# Patient Record
Sex: Female | Born: 1950 | Race: White | Hispanic: No | Marital: Married | Smoking: Never smoker
Health system: Southern US, Community
[De-identification: ages and names within clinical notes are randomized; demographics above are authoritative.]

## PROBLEM LIST (undated history)

## (undated) DIAGNOSIS — E039 Hypothyroidism, unspecified: Secondary | ICD-10-CM

## (undated) DIAGNOSIS — C44310 Basal cell carcinoma of skin of unspecified parts of face: Secondary | ICD-10-CM

## (undated) DIAGNOSIS — M659 Unspecified synovitis and tenosynovitis, unspecified site: Secondary | ICD-10-CM

## (undated) DIAGNOSIS — E7800 Pure hypercholesterolemia, unspecified: Secondary | ICD-10-CM

## (undated) DIAGNOSIS — Z9109 Other allergy status, other than to drugs and biological substances: Secondary | ICD-10-CM

## (undated) DIAGNOSIS — H8110 Benign paroxysmal vertigo, unspecified ear: Secondary | ICD-10-CM

## (undated) HISTORY — DX: Hypothyroidism, unspecified: E03.9

## (undated) HISTORY — DX: Other allergy status, other than to drugs and biological substances: Z91.09

## (undated) HISTORY — DX: Basal cell carcinoma of skin of unspecified parts of face: C44.310

## (undated) HISTORY — DX: Pure hypercholesterolemia, unspecified: E78.00

## (undated) HISTORY — DX: Synovitis and tenosynovitis, unspecified: M65.9

## (undated) HISTORY — PX: TONSILLECTOMY: SUR1361

## (undated) HISTORY — PX: OTHER SURGICAL HISTORY: SHX169

## (undated) HISTORY — DX: Benign paroxysmal vertigo, unspecified ear: H81.10

## (undated) HISTORY — DX: Unspecified synovitis and tenosynovitis, unspecified site: M65.90

---

## 1997-07-31 ENCOUNTER — Other Ambulatory Visit: Admission: RE | Admit: 1997-07-31 | Discharge: 1997-07-31 | Payer: Self-pay | Admitting: Gynecology

## 1999-02-27 ENCOUNTER — Other Ambulatory Visit: Admission: RE | Admit: 1999-02-27 | Discharge: 1999-02-27 | Payer: Self-pay | Admitting: Gynecology

## 1999-09-18 ENCOUNTER — Encounter: Admission: RE | Admit: 1999-09-18 | Discharge: 1999-09-18 | Payer: Self-pay | Admitting: Family Medicine

## 1999-09-18 ENCOUNTER — Encounter: Payer: Self-pay | Admitting: Family Medicine

## 2000-05-11 ENCOUNTER — Encounter: Payer: Self-pay | Admitting: Gynecology

## 2000-05-11 ENCOUNTER — Encounter: Admission: RE | Admit: 2000-05-11 | Discharge: 2000-05-11 | Payer: Self-pay | Admitting: Gynecology

## 2000-05-26 ENCOUNTER — Other Ambulatory Visit: Admission: RE | Admit: 2000-05-26 | Discharge: 2000-05-26 | Payer: Self-pay | Admitting: Gynecology

## 2000-06-04 ENCOUNTER — Encounter (INDEPENDENT_AMBULATORY_CARE_PROVIDER_SITE_OTHER): Payer: Self-pay

## 2000-06-04 ENCOUNTER — Other Ambulatory Visit: Admission: RE | Admit: 2000-06-04 | Discharge: 2000-06-04 | Payer: Self-pay | Admitting: Gynecology

## 2000-12-13 ENCOUNTER — Encounter: Admission: RE | Admit: 2000-12-13 | Discharge: 2000-12-13 | Payer: Self-pay | Admitting: Obstetrics and Gynecology

## 2000-12-13 ENCOUNTER — Encounter: Payer: Self-pay | Admitting: Obstetrics and Gynecology

## 2000-12-15 ENCOUNTER — Encounter: Payer: Self-pay | Admitting: Obstetrics and Gynecology

## 2000-12-15 ENCOUNTER — Encounter: Admission: RE | Admit: 2000-12-15 | Discharge: 2000-12-15 | Payer: Self-pay | Admitting: Obstetrics and Gynecology

## 2001-10-04 ENCOUNTER — Encounter: Admission: RE | Admit: 2001-10-04 | Discharge: 2001-10-04 | Payer: Self-pay | Admitting: Obstetrics and Gynecology

## 2001-10-04 ENCOUNTER — Encounter: Payer: Self-pay | Admitting: Obstetrics and Gynecology

## 2002-10-11 ENCOUNTER — Encounter: Payer: Self-pay | Admitting: Obstetrics and Gynecology

## 2002-10-11 ENCOUNTER — Encounter: Admission: RE | Admit: 2002-10-11 | Discharge: 2002-10-11 | Payer: Self-pay | Admitting: Obstetrics and Gynecology

## 2003-10-17 ENCOUNTER — Other Ambulatory Visit: Admission: RE | Admit: 2003-10-17 | Discharge: 2003-10-17 | Payer: Self-pay | Admitting: Obstetrics and Gynecology

## 2004-07-09 ENCOUNTER — Ambulatory Visit (HOSPITAL_COMMUNITY): Admission: RE | Admit: 2004-07-09 | Discharge: 2004-07-09 | Payer: Self-pay | Admitting: Obstetrics and Gynecology

## 2004-07-16 ENCOUNTER — Encounter: Admission: RE | Admit: 2004-07-16 | Discharge: 2004-07-16 | Payer: Self-pay | Admitting: Obstetrics and Gynecology

## 2004-11-25 ENCOUNTER — Other Ambulatory Visit: Admission: RE | Admit: 2004-11-25 | Discharge: 2004-11-25 | Payer: Self-pay | Admitting: Obstetrics and Gynecology

## 2005-02-17 ENCOUNTER — Encounter: Admission: RE | Admit: 2005-02-17 | Discharge: 2005-05-18 | Payer: Self-pay

## 2005-07-13 ENCOUNTER — Ambulatory Visit (HOSPITAL_COMMUNITY): Admission: RE | Admit: 2005-07-13 | Discharge: 2005-07-13 | Payer: Self-pay | Admitting: Obstetrics and Gynecology

## 2005-11-26 ENCOUNTER — Other Ambulatory Visit: Admission: RE | Admit: 2005-11-26 | Discharge: 2005-11-26 | Payer: Self-pay | Admitting: Obstetrics and Gynecology

## 2006-08-04 ENCOUNTER — Ambulatory Visit (HOSPITAL_COMMUNITY): Admission: RE | Admit: 2006-08-04 | Discharge: 2006-08-04 | Payer: Self-pay | Admitting: Obstetrics and Gynecology

## 2007-02-11 ENCOUNTER — Other Ambulatory Visit: Admission: RE | Admit: 2007-02-11 | Discharge: 2007-02-11 | Payer: Self-pay | Admitting: Obstetrics and Gynecology

## 2007-11-24 ENCOUNTER — Ambulatory Visit (HOSPITAL_COMMUNITY): Admission: RE | Admit: 2007-11-24 | Discharge: 2007-11-24 | Payer: Self-pay | Admitting: Obstetrics and Gynecology

## 2008-02-16 ENCOUNTER — Other Ambulatory Visit: Admission: RE | Admit: 2008-02-16 | Discharge: 2008-02-16 | Payer: Self-pay | Admitting: Obstetrics and Gynecology

## 2008-06-05 ENCOUNTER — Ambulatory Visit: Payer: Self-pay | Admitting: Internal Medicine

## 2008-06-13 LAB — CBC WITH DIFFERENTIAL/PLATELET
BASO%: 0.4 % (ref 0.0–2.0)
Basophils Absolute: 0 10*3/uL (ref 0.0–0.1)
EOS%: 2.3 % (ref 0.0–7.0)
HGB: 15.7 g/dL (ref 11.6–15.9)
MCH: 30.3 pg (ref 25.1–34.0)
MONO%: 7.8 % (ref 0.0–14.0)
Platelets: 69 10*3/uL — ABNORMAL LOW (ref 145–400)
RBC: 5.2 10*6/uL (ref 3.70–5.45)
RDW: 13.1 % (ref 11.2–14.5)
WBC: 5.1 10*3/uL (ref 3.9–10.3)

## 2008-06-13 LAB — COMPREHENSIVE METABOLIC PANEL
Alkaline Phosphatase: 74 U/L (ref 39–117)
CO2: 25 mEq/L (ref 19–32)
Calcium: 9.9 mg/dL (ref 8.4–10.5)
Glucose, Bld: 97 mg/dL (ref 70–99)
Potassium: 4.2 mEq/L (ref 3.5–5.3)

## 2008-06-13 LAB — LACTATE DEHYDROGENASE: LDH: 132 U/L (ref 94–250)

## 2008-07-03 ENCOUNTER — Ambulatory Visit (HOSPITAL_COMMUNITY): Admission: RE | Admit: 2008-07-03 | Discharge: 2008-07-03 | Payer: Self-pay | Admitting: Internal Medicine

## 2008-07-05 LAB — CBC WITH DIFFERENTIAL/PLATELET
Basophils Absolute: 0 10*3/uL (ref 0.0–0.1)
EOS%: 2.2 % (ref 0.0–7.0)
HCT: 45.8 % (ref 34.8–46.6)
HGB: 16 g/dL — ABNORMAL HIGH (ref 11.6–15.9)
MONO%: 7.5 % (ref 0.0–14.0)
lymph#: 1.5 10*3/uL (ref 0.9–3.3)

## 2008-10-02 ENCOUNTER — Ambulatory Visit: Payer: Self-pay | Admitting: Internal Medicine

## 2008-10-04 LAB — CBC WITH DIFFERENTIAL/PLATELET
BASO%: 0.4 % (ref 0.0–2.0)
Basophils Absolute: 0 10*3/uL (ref 0.0–0.1)
EOS%: 1.7 % (ref 0.0–7.0)
Eosinophils Absolute: 0.1 10*3/uL (ref 0.0–0.5)
MCHC: 35.6 g/dL (ref 31.5–36.0)
MCV: 86.9 fL (ref 79.5–101.0)
MONO%: 7.6 % (ref 0.0–14.0)
Platelets: 74 10*3/uL — ABNORMAL LOW (ref 145–400)
RBC: 5.14 10*6/uL (ref 3.70–5.45)
RDW: 13.7 % (ref 11.2–14.5)
WBC: 6.1 10*3/uL (ref 3.9–10.3)
lymph#: 1.8 10*3/uL (ref 0.9–3.3)

## 2008-10-04 LAB — LACTATE DEHYDROGENASE: LDH: 119 U/L (ref 94–250)

## 2008-12-31 ENCOUNTER — Ambulatory Visit: Payer: Self-pay | Admitting: Internal Medicine

## 2009-01-02 LAB — CBC WITH DIFFERENTIAL/PLATELET
HCT: 45.3 % (ref 34.8–46.6)
HGB: 15.9 g/dL (ref 11.6–15.9)
MCH: 30.9 pg (ref 25.1–34.0)
MCV: 87.8 fL (ref 79.5–101.0)
NEUT#: 4.3 10*3/uL (ref 1.5–6.5)
RDW: 13.2 % (ref 11.2–14.5)
WBC: 7.4 10*3/uL (ref 3.9–10.3)
lymph#: 2.4 10*3/uL (ref 0.9–3.3)

## 2009-01-02 LAB — COMPREHENSIVE METABOLIC PANEL
AST: 24 U/L (ref 0–37)
Albumin: 4 g/dL (ref 3.5–5.2)
BUN: 13 mg/dL (ref 6–23)
Calcium: 9.6 mg/dL (ref 8.4–10.5)
Chloride: 106 mEq/L (ref 96–112)
Glucose, Bld: 114 mg/dL — ABNORMAL HIGH (ref 70–99)
Total Bilirubin: 0.9 mg/dL (ref 0.3–1.2)
Total Protein: 7.1 g/dL (ref 6.0–8.3)

## 2009-03-11 ENCOUNTER — Other Ambulatory Visit: Admission: RE | Admit: 2009-03-11 | Discharge: 2009-03-11 | Payer: Self-pay | Admitting: Obstetrics and Gynecology

## 2009-07-01 ENCOUNTER — Ambulatory Visit: Payer: Self-pay | Admitting: Internal Medicine

## 2009-07-03 LAB — CBC WITH DIFFERENTIAL/PLATELET
BASO%: 0.3 % (ref 0.0–2.0)
Basophils Absolute: 0 10*3/uL (ref 0.0–0.1)
EOS%: 2.3 % (ref 0.0–7.0)
Eosinophils Absolute: 0.2 10*3/uL (ref 0.0–0.5)
LYMPH%: 38.2 % (ref 14.0–49.7)
MCH: 30.1 pg (ref 25.1–34.0)
MONO#: 0.6 10*3/uL (ref 0.1–0.9)
Platelets: 82 10*3/uL — ABNORMAL LOW (ref 145–400)
RDW: 13.2 % (ref 11.2–14.5)
WBC: 7.2 10*3/uL (ref 3.9–10.3)

## 2009-09-27 ENCOUNTER — Ambulatory Visit: Payer: Self-pay | Admitting: Internal Medicine

## 2009-10-01 LAB — CBC WITH DIFFERENTIAL/PLATELET
EOS%: 2.6 % (ref 0.0–7.0)
Eosinophils Absolute: 0.2 10*3/uL (ref 0.0–0.5)
HGB: 16.8 g/dL — ABNORMAL HIGH (ref 11.6–15.9)
MCV: 88.5 fL (ref 79.5–101.0)
MONO#: 0.5 10*3/uL (ref 0.1–0.9)
MONO%: 5.5 % (ref 0.0–14.0)
NEUT#: 4.8 10*3/uL (ref 1.5–6.5)
RDW: 13 % (ref 11.2–14.5)
WBC: 8.3 10*3/uL (ref 3.9–10.3)
lymph#: 2.8 10*3/uL (ref 0.9–3.3)

## 2010-03-31 ENCOUNTER — Ambulatory Visit: Payer: Self-pay | Admitting: Internal Medicine

## 2010-05-10 IMAGING — US US ABDOMEN COMPLETE
1 series · 14 of 25 positions shown · non-contrast
Comparison: None

CLINICAL DATA: Thrombocytopenia; evaluate for hepatosplenomegaly

ABDOMINAL ULTRASOUND
TECHNIQUE: Abdominal ultrasound examination was performed
including evaluation of the liver, gallbladder, bile ducts,
pancreas, kidneys, spleen, IVC, and abdominal aorta.

[Series 1: us abdomen complete · 0.31mm/px · 14 of 44 slices shown]
[im 1/44]
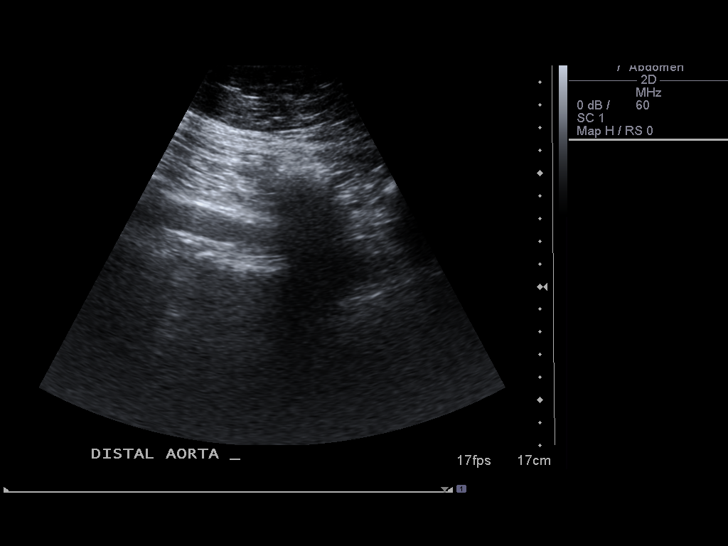
[im 4/44]
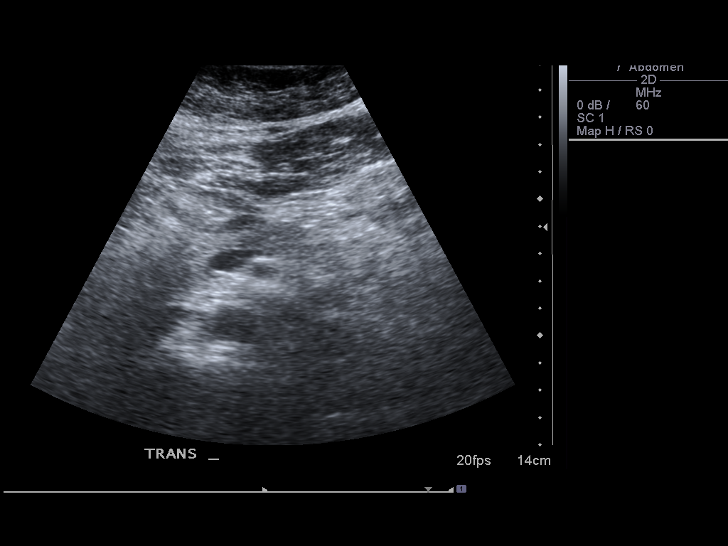
[im 8/44]
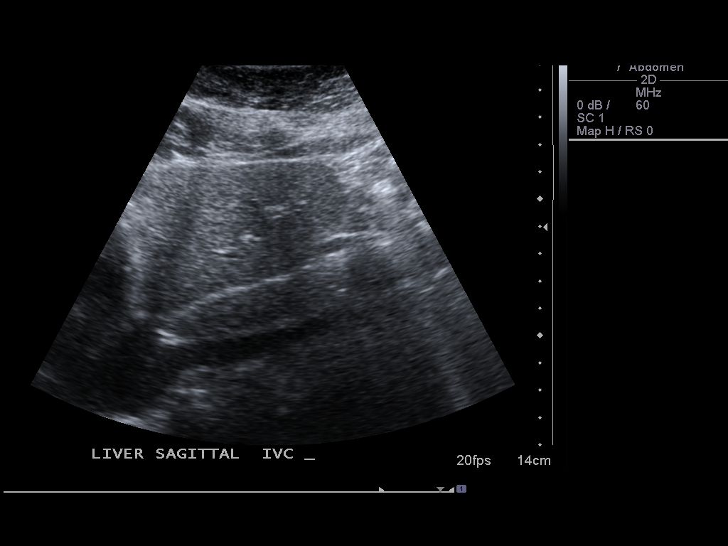
[im 11/44]
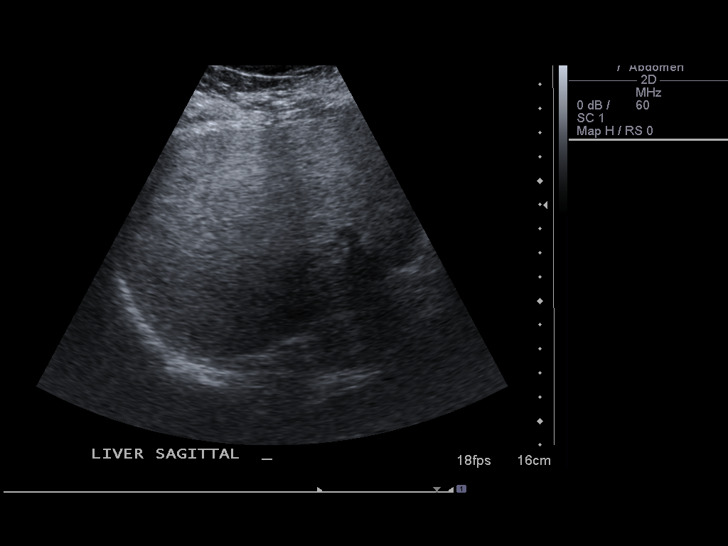
[im 15/44]
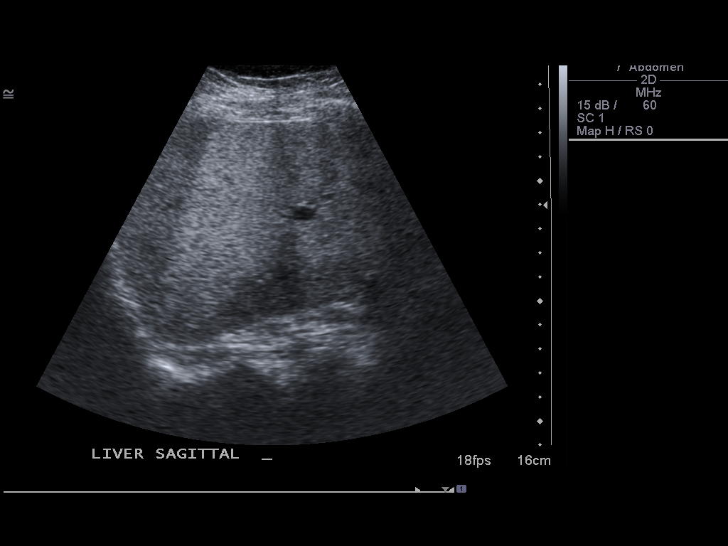
[im 17/44]
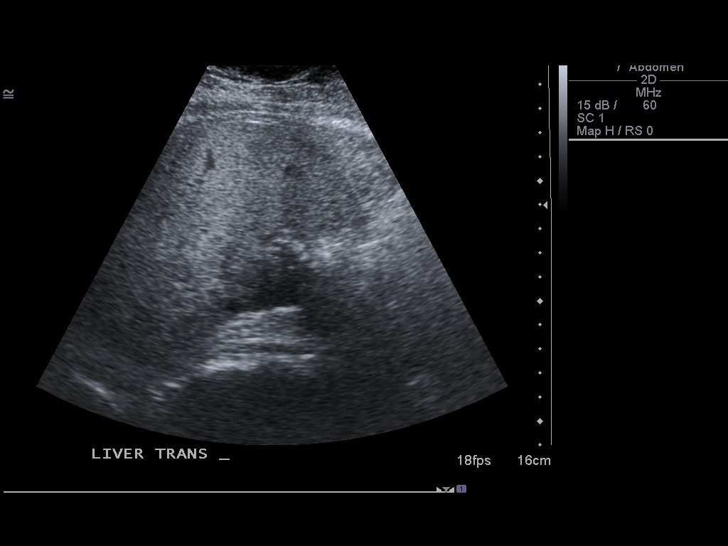
[im 20/44]
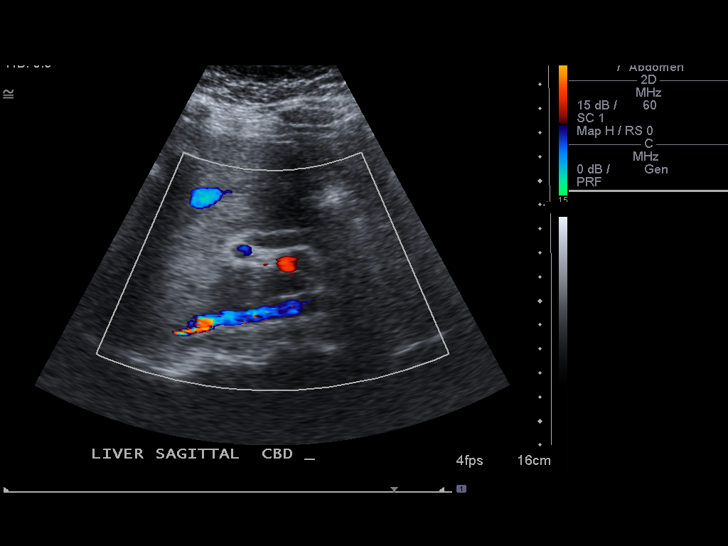
[im 24/44]
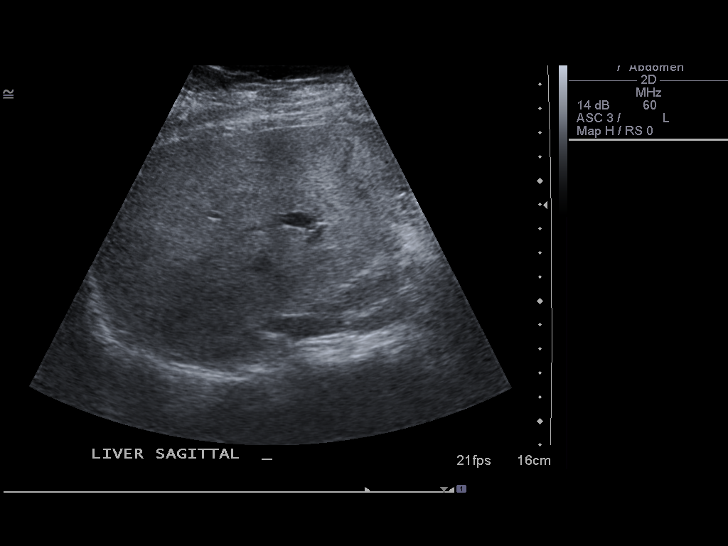
[im 27/44]
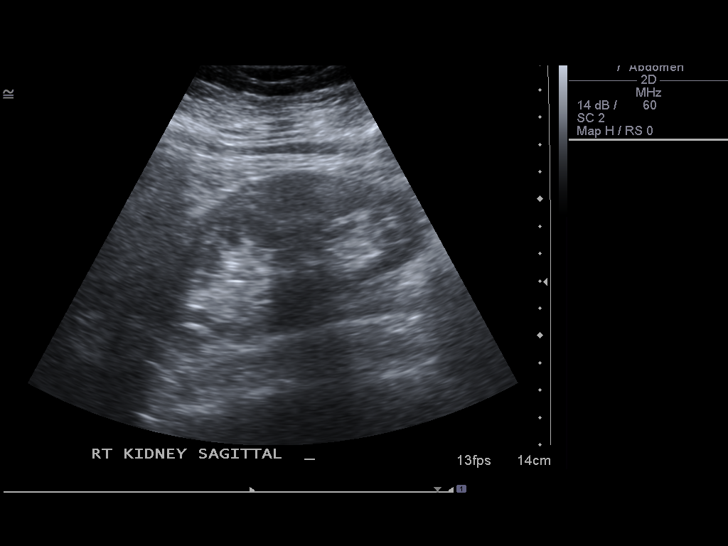
[im 29/44]
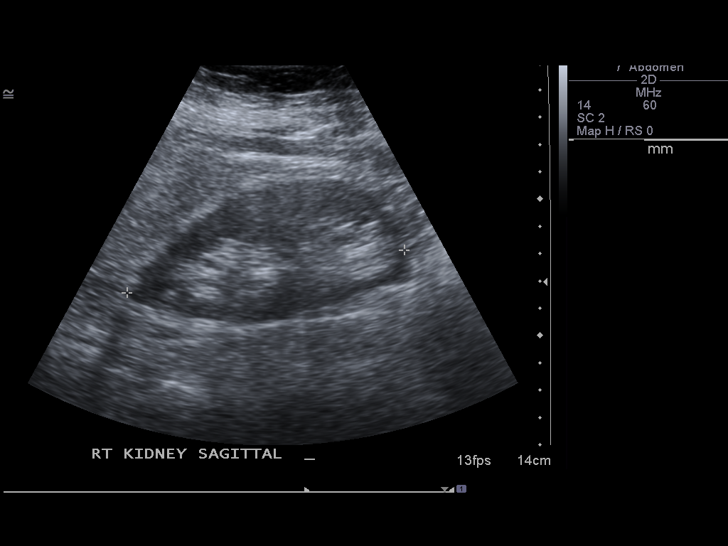
[im 33/44]
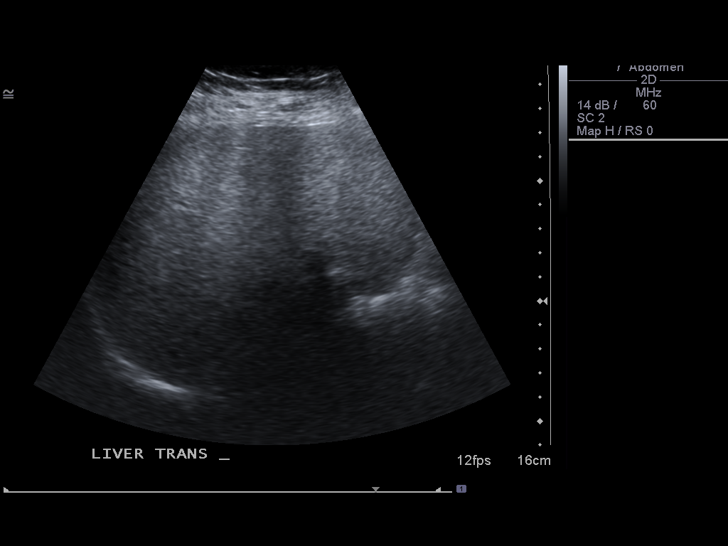
[im 36/44]
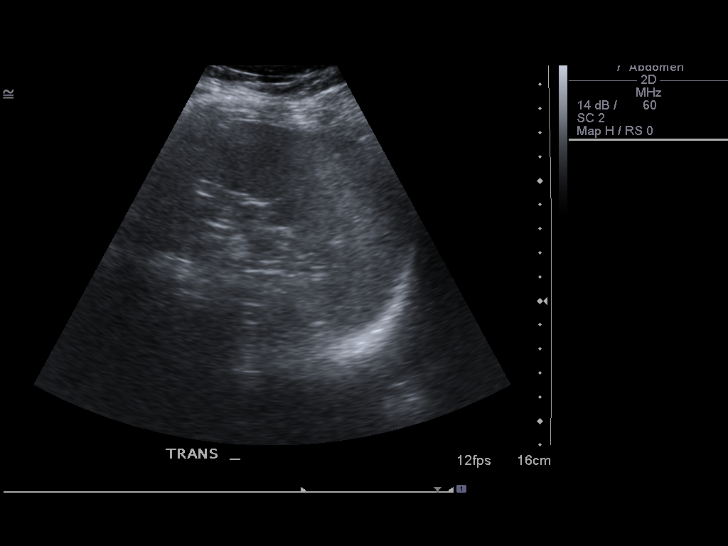
[im 40/44]
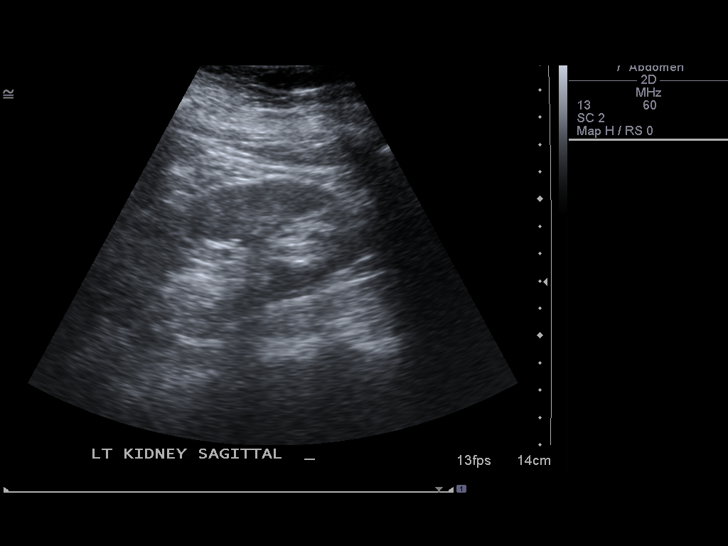
[im 44/44]
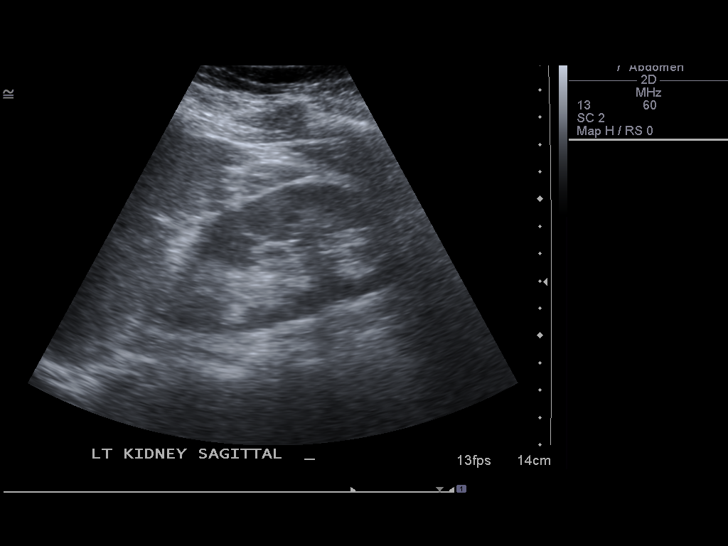

[14 of 25 positions shown; findings below may reference images not displayed]

FINDINGS: Gallbladder:  Surgically absent.

Common bile duct: Within normal limits in caliber.

Liver:  No focal parenchymal abnormalities.  Normal size.  There is
marked, diffuse fatty infiltration.

Inferior vena cava:  Visualized portion unremarkable.

Pancreas:  Visualized portion unremarkable.

Spleen:  Within normal limits in size and echogenicity.

Right kidney:  Within normal limits in size and echogenicity. No
evidence of mass or hydronephrosis.

Left kidney:  Within normal limits in size and echogenicity. No
evidence of mass or hydronephrosis.

Abdominal aorta:  Within normal limits in caliber.
IMPRESSION: There is marked, diffuse fatty infiltration of the liver.

## 2012-05-11 ENCOUNTER — Other Ambulatory Visit (HOSPITAL_COMMUNITY): Payer: Self-pay | Admitting: Obstetrics and Gynecology

## 2012-05-11 DIAGNOSIS — R1031 Right lower quadrant pain: Secondary | ICD-10-CM

## 2012-05-16 ENCOUNTER — Ambulatory Visit (HOSPITAL_COMMUNITY)
Admission: RE | Admit: 2012-05-16 | Discharge: 2012-05-16 | Disposition: A | Discharge status: Home / Self Care | Payer: BC Managed Care – PPO | Source: Ambulatory Visit | Attending: Obstetrics and Gynecology | Admitting: Obstetrics and Gynecology

## 2012-05-16 ENCOUNTER — Encounter (HOSPITAL_COMMUNITY): Payer: Self-pay

## 2012-05-16 DIAGNOSIS — R1031 Right lower quadrant pain: Secondary | ICD-10-CM | POA: Insufficient documentation

## 2012-05-16 DIAGNOSIS — K5730 Diverticulosis of large intestine without perforation or abscess without bleeding: Secondary | ICD-10-CM | POA: Insufficient documentation

## 2012-05-16 MED ORDER — IOHEXOL 300 MG/ML  SOLN
100.0000 mL | Freq: Once | INTRAMUSCULAR | Status: AC | PRN
  Administered 2012-05-16: 100 mL via INTRAVENOUS

## 2013-07-04 ENCOUNTER — Encounter: Payer: Self-pay | Admitting: Podiatry

## 2013-07-04 ENCOUNTER — Ambulatory Visit (INDEPENDENT_AMBULATORY_CARE_PROVIDER_SITE_OTHER): Payer: BC Managed Care – PPO | Admitting: Podiatry

## 2013-07-04 VITALS — BP 114/86 | HR 82 | Resp 12

## 2013-07-04 DIAGNOSIS — L600 Ingrowing nail: Secondary | ICD-10-CM

## 2013-07-04 MED ORDER — NEOMYCIN-POLYMYXIN-HC 3.5-10000-1 OT SOLN: OTIC | Status: DC

## 2013-07-04 NOTE — Progress Notes (Signed)
   Subjective:    Patient ID: Kirsten Jones, female    DOB: 05-19-1950, 63 y.o.   MRN: [id number]  HPI PT STATED RT FOOT GREAT TOENAIL IS A LITTLE SORE FOR 1 WEEK. THE TOENAIL IS GETTING WORSE. TRIED TO KEEP TRIM BUT STILL HURTING .    Review of Systems  HENT: Positive for hearing loss.   All other systems reviewed and are negative.       Objective:   Physical Exam: I have reviewed her past history medications allergies surgeries and social history. Pulses are strongly palpable right foot. Neurologic sensorium is intact. Tibial border of the hallux right does demonstrate erythema and edema with gross granulation tissue and is painful on palpation.          Assessment & Plan:  Assessment: Paronychia abscess ingrown nail tibial border hallux right.  Plan: Matrixectomy was performed today under local anesthetic. She tolerated procedure well she will start soaking on a twice a day basis in Betadine warm water and apply Cortisporin otic as directed. I will followup with her in one week.

## 2013-07-04 NOTE — Patient Instructions (Signed)

## 2013-07-13 ENCOUNTER — Encounter: Payer: Self-pay | Admitting: Podiatry

## 2013-07-13 ENCOUNTER — Ambulatory Visit (INDEPENDENT_AMBULATORY_CARE_PROVIDER_SITE_OTHER): Payer: BC Managed Care – PPO | Admitting: Podiatry

## 2013-07-13 VITALS — BP 112/86 | HR 80 | Resp 16

## 2013-07-13 DIAGNOSIS — L600 Ingrowing nail: Secondary | ICD-10-CM

## 2013-07-13 NOTE — Progress Notes (Signed)
She presents today for followup of a matrixectomy of the hallux. She denies fever chills nausea vomiting muscle aches and pains. She's as I don't think it looks very good and I stopped soaking.  Objective: Vital signs are stable she is alert and oriented x3. The margin of the hallux right tibial border appears to be healing quite nicely there is no erythema edema cellulitis drainage or odor.  Assessment: Well-healing matrixectomy hallux right.  Plan: Discontinue Betadine start with Epsom salts in warm water soaks covered in the day and Levaquin night continued Cortisporin Otic drops.

## 2015-04-24 DIAGNOSIS — H52223 Regular astigmatism, bilateral: Secondary | ICD-10-CM | POA: Diagnosis not present

## 2015-04-24 DIAGNOSIS — H524 Presbyopia: Secondary | ICD-10-CM | POA: Diagnosis not present

## 2015-04-24 DIAGNOSIS — H5213 Myopia, bilateral: Secondary | ICD-10-CM | POA: Diagnosis not present

## 2015-04-24 DIAGNOSIS — H2513 Age-related nuclear cataract, bilateral: Secondary | ICD-10-CM | POA: Diagnosis not present

## 2015-05-14 DIAGNOSIS — L718 Other rosacea: Secondary | ICD-10-CM | POA: Diagnosis not present

## 2015-05-14 DIAGNOSIS — C44712 Basal cell carcinoma of skin of right lower limb, including hip: Secondary | ICD-10-CM | POA: Diagnosis not present

## 2015-05-14 DIAGNOSIS — L570 Actinic keratosis: Secondary | ICD-10-CM | POA: Diagnosis not present

## 2015-05-14 DIAGNOSIS — D485 Neoplasm of uncertain behavior of skin: Secondary | ICD-10-CM | POA: Diagnosis not present

## 2015-07-03 DIAGNOSIS — E28310 Symptomatic premature menopause: Secondary | ICD-10-CM | POA: Diagnosis not present

## 2015-07-03 DIAGNOSIS — E569 Vitamin deficiency, unspecified: Secondary | ICD-10-CM | POA: Diagnosis not present

## 2015-07-03 DIAGNOSIS — D510 Vitamin B12 deficiency anemia due to intrinsic factor deficiency: Secondary | ICD-10-CM | POA: Diagnosis not present

## 2015-07-03 DIAGNOSIS — E039 Hypothyroidism, unspecified: Secondary | ICD-10-CM | POA: Diagnosis not present

## 2015-07-03 DIAGNOSIS — E559 Vitamin D deficiency, unspecified: Secondary | ICD-10-CM | POA: Diagnosis not present

## 2015-08-06 DIAGNOSIS — C44722 Squamous cell carcinoma of skin of right lower limb, including hip: Secondary | ICD-10-CM | POA: Diagnosis not present

## 2015-08-06 DIAGNOSIS — D485 Neoplasm of uncertain behavior of skin: Secondary | ICD-10-CM | POA: Diagnosis not present

## 2015-08-06 DIAGNOSIS — L309 Dermatitis, unspecified: Secondary | ICD-10-CM | POA: Diagnosis not present

## 2015-08-06 DIAGNOSIS — L819 Disorder of pigmentation, unspecified: Secondary | ICD-10-CM | POA: Diagnosis not present

## 2015-08-06 DIAGNOSIS — D225 Melanocytic nevi of trunk: Secondary | ICD-10-CM | POA: Diagnosis not present

## 2015-08-06 DIAGNOSIS — L570 Actinic keratosis: Secondary | ICD-10-CM | POA: Diagnosis not present

## 2015-08-06 DIAGNOSIS — Z85828 Personal history of other malignant neoplasm of skin: Secondary | ICD-10-CM | POA: Diagnosis not present

## 2015-08-06 DIAGNOSIS — L821 Other seborrheic keratosis: Secondary | ICD-10-CM | POA: Diagnosis not present

## 2015-08-06 DIAGNOSIS — L814 Other melanin hyperpigmentation: Secondary | ICD-10-CM | POA: Diagnosis not present

## 2015-09-25 DIAGNOSIS — D510 Vitamin B12 deficiency anemia due to intrinsic factor deficiency: Secondary | ICD-10-CM | POA: Diagnosis not present

## 2015-09-25 DIAGNOSIS — E039 Hypothyroidism, unspecified: Secondary | ICD-10-CM | POA: Diagnosis not present

## 2015-09-25 DIAGNOSIS — E569 Vitamin deficiency, unspecified: Secondary | ICD-10-CM | POA: Diagnosis not present

## 2015-09-25 DIAGNOSIS — R799 Abnormal finding of blood chemistry, unspecified: Secondary | ICD-10-CM | POA: Diagnosis not present

## 2015-09-25 DIAGNOSIS — E28310 Symptomatic premature menopause: Secondary | ICD-10-CM | POA: Diagnosis not present

## 2015-09-25 DIAGNOSIS — R5382 Chronic fatigue, unspecified: Secondary | ICD-10-CM | POA: Diagnosis not present

## 2015-09-25 DIAGNOSIS — E559 Vitamin D deficiency, unspecified: Secondary | ICD-10-CM | POA: Diagnosis not present

## 2015-10-04 DIAGNOSIS — R0789 Other chest pain: Secondary | ICD-10-CM | POA: Diagnosis not present

## 2015-10-04 DIAGNOSIS — N8110 Cystocele, unspecified: Secondary | ICD-10-CM | POA: Diagnosis not present

## 2015-10-04 DIAGNOSIS — N819 Female genital prolapse, unspecified: Secondary | ICD-10-CM | POA: Diagnosis not present

## 2015-10-23 DIAGNOSIS — R0789 Other chest pain: Secondary | ICD-10-CM | POA: Diagnosis not present

## 2015-10-23 DIAGNOSIS — R0602 Shortness of breath: Secondary | ICD-10-CM | POA: Diagnosis not present

## 2015-10-23 DIAGNOSIS — Z8249 Family history of ischemic heart disease and other diseases of the circulatory system: Secondary | ICD-10-CM | POA: Diagnosis not present

## 2015-10-23 DIAGNOSIS — E785 Hyperlipidemia, unspecified: Secondary | ICD-10-CM | POA: Diagnosis not present

## 2015-10-23 DIAGNOSIS — R079 Chest pain, unspecified: Secondary | ICD-10-CM | POA: Diagnosis not present

## 2015-10-23 DIAGNOSIS — R0600 Dyspnea, unspecified: Secondary | ICD-10-CM | POA: Diagnosis not present

## 2015-11-05 DIAGNOSIS — R0600 Dyspnea, unspecified: Secondary | ICD-10-CM | POA: Diagnosis not present

## 2015-11-15 DIAGNOSIS — Z85828 Personal history of other malignant neoplasm of skin: Secondary | ICD-10-CM | POA: Diagnosis not present

## 2015-11-15 DIAGNOSIS — L812 Freckles: Secondary | ICD-10-CM | POA: Diagnosis not present

## 2015-11-15 DIAGNOSIS — L821 Other seborrheic keratosis: Secondary | ICD-10-CM | POA: Diagnosis not present

## 2015-12-17 ENCOUNTER — Other Ambulatory Visit: Payer: Self-pay | Admitting: Obstetrics and Gynecology

## 2015-12-17 ENCOUNTER — Other Ambulatory Visit (HOSPITAL_COMMUNITY)
Admission: RE | Admit: 2015-12-17 | Discharge: 2015-12-17 | Disposition: A | Discharge status: Home / Self Care | Payer: PPO | Source: Ambulatory Visit | Attending: Obstetrics and Gynecology | Admitting: Obstetrics and Gynecology

## 2015-12-17 DIAGNOSIS — Z01419 Encounter for gynecological examination (general) (routine) without abnormal findings: Secondary | ICD-10-CM | POA: Diagnosis not present

## 2015-12-17 DIAGNOSIS — N950 Postmenopausal bleeding: Secondary | ICD-10-CM | POA: Diagnosis not present

## 2015-12-17 DIAGNOSIS — Z1151 Encounter for screening for human papillomavirus (HPV): Secondary | ICD-10-CM | POA: Insufficient documentation

## 2015-12-17 DIAGNOSIS — Z01411 Encounter for gynecological examination (general) (routine) with abnormal findings: Secondary | ICD-10-CM | POA: Diagnosis not present

## 2015-12-18 LAB — CYTOLOGY - PAP

## 2015-12-24 DIAGNOSIS — Z85828 Personal history of other malignant neoplasm of skin: Secondary | ICD-10-CM | POA: Diagnosis not present

## 2015-12-24 DIAGNOSIS — L738 Other specified follicular disorders: Secondary | ICD-10-CM | POA: Diagnosis not present

## 2016-01-09 DIAGNOSIS — Z1211 Encounter for screening for malignant neoplasm of colon: Secondary | ICD-10-CM | POA: Diagnosis not present

## 2016-01-09 DIAGNOSIS — Z1389 Encounter for screening for other disorder: Secondary | ICD-10-CM | POA: Diagnosis not present

## 2016-01-09 DIAGNOSIS — N950 Postmenopausal bleeding: Secondary | ICD-10-CM | POA: Diagnosis not present

## 2016-01-09 DIAGNOSIS — Z0000 Encounter for general adult medical examination without abnormal findings: Secondary | ICD-10-CM | POA: Diagnosis not present

## 2016-01-09 DIAGNOSIS — E7800 Pure hypercholesterolemia, unspecified: Secondary | ICD-10-CM | POA: Diagnosis not present

## 2016-01-09 DIAGNOSIS — Z23 Encounter for immunization: Secondary | ICD-10-CM | POA: Diagnosis not present

## 2016-01-09 DIAGNOSIS — Z974 Presence of external hearing-aid: Secondary | ICD-10-CM | POA: Diagnosis not present

## 2016-01-22 ENCOUNTER — Other Ambulatory Visit: Payer: Self-pay | Admitting: Obstetrics and Gynecology

## 2016-01-22 DIAGNOSIS — N950 Postmenopausal bleeding: Secondary | ICD-10-CM | POA: Diagnosis not present

## 2016-02-19 DIAGNOSIS — R5382 Chronic fatigue, unspecified: Secondary | ICD-10-CM | POA: Diagnosis not present

## 2016-02-19 DIAGNOSIS — Z780 Asymptomatic menopausal state: Secondary | ICD-10-CM | POA: Diagnosis not present

## 2016-02-19 DIAGNOSIS — R799 Abnormal finding of blood chemistry, unspecified: Secondary | ICD-10-CM | POA: Diagnosis not present

## 2016-02-19 DIAGNOSIS — D510 Vitamin B12 deficiency anemia due to intrinsic factor deficiency: Secondary | ICD-10-CM | POA: Diagnosis not present

## 2016-02-19 DIAGNOSIS — E569 Vitamin deficiency, unspecified: Secondary | ICD-10-CM | POA: Diagnosis not present

## 2016-02-19 DIAGNOSIS — E559 Vitamin D deficiency, unspecified: Secondary | ICD-10-CM | POA: Diagnosis not present

## 2016-02-19 DIAGNOSIS — E039 Hypothyroidism, unspecified: Secondary | ICD-10-CM | POA: Diagnosis not present

## 2016-03-20 ENCOUNTER — Other Ambulatory Visit: Payer: Self-pay | Admitting: Gastroenterology

## 2016-03-29 DIAGNOSIS — J209 Acute bronchitis, unspecified: Secondary | ICD-10-CM | POA: Diagnosis not present

## 2016-03-29 DIAGNOSIS — R05 Cough: Secondary | ICD-10-CM | POA: Diagnosis not present

## 2016-05-05 ENCOUNTER — Encounter (HOSPITAL_COMMUNITY): Payer: Self-pay | Admitting: Anesthesiology

## 2016-05-05 ENCOUNTER — Encounter (HOSPITAL_COMMUNITY): Admission: RE | Payer: Self-pay | Source: Ambulatory Visit

## 2016-05-05 ENCOUNTER — Ambulatory Visit (HOSPITAL_COMMUNITY): Admission: RE | Admit: 2016-05-05 | Payer: PPO | Source: Ambulatory Visit | Admitting: Gastroenterology

## 2016-05-05 SURGERY — COLONOSCOPY WITH PROPOFOL
Anesthesia: Monitor Anesthesia Care

## 2016-05-05 MED ORDER — PROPOFOL 10 MG/ML IV BOLUS
INTRAVENOUS | Status: AC
  Filled 2016-05-05: qty 40

## 2016-05-05 NOTE — Anesthesia Preprocedure Evaluation (Deleted)
Anesthesia Evaluation  Patient identified by MRN, date of birth, ID band Patient awake    Reviewed: Allergy & Precautions, NPO status , Patient's Chart, lab work & pertinent test results  Airway Mallampati: II  TM Distance: >3 FB Neck ROM: Full    Dental  (+) Teeth Intact, Dental Advisory Given   Pulmonary neg pulmonary ROS,    Pulmonary exam normal breath sounds clear to auscultation       Cardiovascular Exercise Tolerance: Good negative cardio ROS Normal cardiovascular exam Rhythm:Regular Rate:Normal     Neuro/Psych negative neurological ROS     GI/Hepatic negative GI ROS, Neg liver ROS,   Endo/Other  Hypothyroidism   Renal/GU negative Renal ROS     Musculoskeletal negative musculoskeletal ROS (+)   Abdominal   Peds  Hematology negative hematology ROS (+)   Anesthesia Other Findings Day of surgery medications reviewed with the patient.  Reproductive/Obstetrics                             Anesthesia Physical Anesthesia Plan  ASA: II  Anesthesia Plan: MAC   Post-op Pain Management:    Induction: Intravenous  Airway Management Planned: Nasal Cannula  Additional Equipment:   Intra-op Plan:   Post-operative Plan:   Informed Consent: I have reviewed the patients History and Physical, chart, labs and discussed the procedure including the risks, benefits and alternatives for the proposed anesthesia with the patient or authorized representative who has indicated his/her understanding and acceptance.   Dental advisory given  Plan Discussed with: CRNA and Anesthesiologist  Anesthesia Plan Comments: (Discussed risks/benefits/alternatives to MAC sedation including need for ventilatory support, hypotension, need for conversion to general anesthesia.  All patient questions answered.  Patient/guardian wishes to proceed.)        Anesthesia Quick Evaluation

## 2016-05-12 DIAGNOSIS — Z780 Asymptomatic menopausal state: Secondary | ICD-10-CM | POA: Diagnosis not present

## 2016-05-12 DIAGNOSIS — E039 Hypothyroidism, unspecified: Secondary | ICD-10-CM | POA: Diagnosis not present

## 2016-05-20 DIAGNOSIS — H2513 Age-related nuclear cataract, bilateral: Secondary | ICD-10-CM | POA: Diagnosis not present

## 2016-06-29 DIAGNOSIS — E039 Hypothyroidism, unspecified: Secondary | ICD-10-CM | POA: Diagnosis not present

## 2016-06-29 DIAGNOSIS — Z780 Asymptomatic menopausal state: Secondary | ICD-10-CM | POA: Diagnosis not present

## 2016-06-29 DIAGNOSIS — E569 Vitamin deficiency, unspecified: Secondary | ICD-10-CM | POA: Diagnosis not present

## 2016-07-28 DIAGNOSIS — R799 Abnormal finding of blood chemistry, unspecified: Secondary | ICD-10-CM | POA: Diagnosis not present

## 2016-07-28 DIAGNOSIS — R5382 Chronic fatigue, unspecified: Secondary | ICD-10-CM | POA: Diagnosis not present

## 2016-07-28 DIAGNOSIS — E039 Hypothyroidism, unspecified: Secondary | ICD-10-CM | POA: Diagnosis not present

## 2016-07-28 DIAGNOSIS — Z139 Encounter for screening, unspecified: Secondary | ICD-10-CM | POA: Diagnosis not present

## 2016-07-28 DIAGNOSIS — E569 Vitamin deficiency, unspecified: Secondary | ICD-10-CM | POA: Diagnosis not present

## 2016-07-28 DIAGNOSIS — E559 Vitamin D deficiency, unspecified: Secondary | ICD-10-CM | POA: Diagnosis not present

## 2016-07-28 DIAGNOSIS — Z780 Asymptomatic menopausal state: Secondary | ICD-10-CM | POA: Diagnosis not present

## 2016-07-28 DIAGNOSIS — D510 Vitamin B12 deficiency anemia due to intrinsic factor deficiency: Secondary | ICD-10-CM | POA: Diagnosis not present

## 2016-07-30 DIAGNOSIS — L821 Other seborrheic keratosis: Secondary | ICD-10-CM | POA: Diagnosis not present

## 2016-07-30 DIAGNOSIS — L718 Other rosacea: Secondary | ICD-10-CM | POA: Diagnosis not present

## 2016-07-30 DIAGNOSIS — D2261 Melanocytic nevi of right upper limb, including shoulder: Secondary | ICD-10-CM | POA: Diagnosis not present

## 2016-07-30 DIAGNOSIS — C44519 Basal cell carcinoma of skin of other part of trunk: Secondary | ICD-10-CM | POA: Diagnosis not present

## 2016-07-30 DIAGNOSIS — D485 Neoplasm of uncertain behavior of skin: Secondary | ICD-10-CM | POA: Diagnosis not present

## 2016-07-30 DIAGNOSIS — Z85828 Personal history of other malignant neoplasm of skin: Secondary | ICD-10-CM | POA: Diagnosis not present

## 2016-07-30 DIAGNOSIS — D1801 Hemangioma of skin and subcutaneous tissue: Secondary | ICD-10-CM | POA: Diagnosis not present

## 2016-09-09 DIAGNOSIS — E039 Hypothyroidism, unspecified: Secondary | ICD-10-CM | POA: Diagnosis not present

## 2016-09-09 DIAGNOSIS — E785 Hyperlipidemia, unspecified: Secondary | ICD-10-CM | POA: Diagnosis not present

## 2016-09-09 DIAGNOSIS — N951 Menopausal and female climacteric states: Secondary | ICD-10-CM | POA: Diagnosis not present

## 2016-09-11 DIAGNOSIS — M545 Low back pain: Secondary | ICD-10-CM | POA: Diagnosis not present

## 2016-09-17 DIAGNOSIS — M545 Low back pain: Secondary | ICD-10-CM | POA: Diagnosis not present

## 2016-09-24 DIAGNOSIS — M545 Low back pain: Secondary | ICD-10-CM | POA: Diagnosis not present

## 2016-09-24 DIAGNOSIS — M9901 Segmental and somatic dysfunction of cervical region: Secondary | ICD-10-CM | POA: Diagnosis not present

## 2016-09-24 DIAGNOSIS — M62830 Muscle spasm of back: Secondary | ICD-10-CM | POA: Diagnosis not present

## 2016-09-24 DIAGNOSIS — M9903 Segmental and somatic dysfunction of lumbar region: Secondary | ICD-10-CM | POA: Diagnosis not present

## 2016-09-28 DIAGNOSIS — M545 Low back pain: Secondary | ICD-10-CM | POA: Diagnosis not present

## 2016-09-28 DIAGNOSIS — M9901 Segmental and somatic dysfunction of cervical region: Secondary | ICD-10-CM | POA: Diagnosis not present

## 2016-09-28 DIAGNOSIS — M9903 Segmental and somatic dysfunction of lumbar region: Secondary | ICD-10-CM | POA: Diagnosis not present

## 2016-09-28 DIAGNOSIS — M62830 Muscle spasm of back: Secondary | ICD-10-CM | POA: Diagnosis not present

## 2016-09-30 DIAGNOSIS — M9901 Segmental and somatic dysfunction of cervical region: Secondary | ICD-10-CM | POA: Diagnosis not present

## 2016-09-30 DIAGNOSIS — M9903 Segmental and somatic dysfunction of lumbar region: Secondary | ICD-10-CM | POA: Diagnosis not present

## 2016-09-30 DIAGNOSIS — M545 Low back pain: Secondary | ICD-10-CM | POA: Diagnosis not present

## 2016-09-30 DIAGNOSIS — M62830 Muscle spasm of back: Secondary | ICD-10-CM | POA: Diagnosis not present

## 2016-10-08 DIAGNOSIS — M9901 Segmental and somatic dysfunction of cervical region: Secondary | ICD-10-CM | POA: Diagnosis not present

## 2016-10-08 DIAGNOSIS — M9903 Segmental and somatic dysfunction of lumbar region: Secondary | ICD-10-CM | POA: Diagnosis not present

## 2016-10-08 DIAGNOSIS — M545 Low back pain: Secondary | ICD-10-CM | POA: Diagnosis not present

## 2016-10-08 DIAGNOSIS — M62830 Muscle spasm of back: Secondary | ICD-10-CM | POA: Diagnosis not present

## 2016-10-13 DIAGNOSIS — M62830 Muscle spasm of back: Secondary | ICD-10-CM | POA: Diagnosis not present

## 2016-10-13 DIAGNOSIS — M545 Low back pain: Secondary | ICD-10-CM | POA: Diagnosis not present

## 2016-10-13 DIAGNOSIS — M9903 Segmental and somatic dysfunction of lumbar region: Secondary | ICD-10-CM | POA: Diagnosis not present

## 2016-10-13 DIAGNOSIS — M9901 Segmental and somatic dysfunction of cervical region: Secondary | ICD-10-CM | POA: Diagnosis not present

## 2016-10-15 DIAGNOSIS — M9901 Segmental and somatic dysfunction of cervical region: Secondary | ICD-10-CM | POA: Diagnosis not present

## 2016-10-15 DIAGNOSIS — M62830 Muscle spasm of back: Secondary | ICD-10-CM | POA: Diagnosis not present

## 2016-10-15 DIAGNOSIS — M9903 Segmental and somatic dysfunction of lumbar region: Secondary | ICD-10-CM | POA: Diagnosis not present

## 2016-10-15 DIAGNOSIS — M545 Low back pain: Secondary | ICD-10-CM | POA: Diagnosis not present

## 2016-10-20 DIAGNOSIS — M9903 Segmental and somatic dysfunction of lumbar region: Secondary | ICD-10-CM | POA: Diagnosis not present

## 2016-10-20 DIAGNOSIS — M62830 Muscle spasm of back: Secondary | ICD-10-CM | POA: Diagnosis not present

## 2016-10-20 DIAGNOSIS — M9901 Segmental and somatic dysfunction of cervical region: Secondary | ICD-10-CM | POA: Diagnosis not present

## 2016-10-20 DIAGNOSIS — M545 Low back pain: Secondary | ICD-10-CM | POA: Diagnosis not present

## 2016-10-22 DIAGNOSIS — M9903 Segmental and somatic dysfunction of lumbar region: Secondary | ICD-10-CM | POA: Diagnosis not present

## 2016-10-22 DIAGNOSIS — M9901 Segmental and somatic dysfunction of cervical region: Secondary | ICD-10-CM | POA: Diagnosis not present

## 2016-10-22 DIAGNOSIS — M545 Low back pain: Secondary | ICD-10-CM | POA: Diagnosis not present

## 2016-10-22 DIAGNOSIS — M62830 Muscle spasm of back: Secondary | ICD-10-CM | POA: Diagnosis not present

## 2016-10-29 DIAGNOSIS — M9903 Segmental and somatic dysfunction of lumbar region: Secondary | ICD-10-CM | POA: Diagnosis not present

## 2016-10-29 DIAGNOSIS — M62830 Muscle spasm of back: Secondary | ICD-10-CM | POA: Diagnosis not present

## 2016-10-29 DIAGNOSIS — R0982 Postnasal drip: Secondary | ICD-10-CM | POA: Diagnosis not present

## 2016-10-29 DIAGNOSIS — M549 Dorsalgia, unspecified: Secondary | ICD-10-CM | POA: Diagnosis not present

## 2016-10-29 DIAGNOSIS — M9901 Segmental and somatic dysfunction of cervical region: Secondary | ICD-10-CM | POA: Diagnosis not present

## 2016-10-29 DIAGNOSIS — M545 Low back pain: Secondary | ICD-10-CM | POA: Diagnosis not present

## 2016-11-03 DIAGNOSIS — M549 Dorsalgia, unspecified: Secondary | ICD-10-CM | POA: Diagnosis not present

## 2016-11-04 DIAGNOSIS — M549 Dorsalgia, unspecified: Secondary | ICD-10-CM | POA: Diagnosis not present

## 2016-11-12 DIAGNOSIS — M549 Dorsalgia, unspecified: Secondary | ICD-10-CM | POA: Diagnosis not present

## 2016-11-20 DIAGNOSIS — M549 Dorsalgia, unspecified: Secondary | ICD-10-CM | POA: Diagnosis not present

## 2016-11-25 DIAGNOSIS — M549 Dorsalgia, unspecified: Secondary | ICD-10-CM | POA: Diagnosis not present

## 2016-12-04 DIAGNOSIS — M549 Dorsalgia, unspecified: Secondary | ICD-10-CM | POA: Diagnosis not present

## 2016-12-07 DIAGNOSIS — M549 Dorsalgia, unspecified: Secondary | ICD-10-CM | POA: Diagnosis not present

## 2016-12-08 DIAGNOSIS — E039 Hypothyroidism, unspecified: Secondary | ICD-10-CM | POA: Diagnosis not present

## 2016-12-08 DIAGNOSIS — D510 Vitamin B12 deficiency anemia due to intrinsic factor deficiency: Secondary | ICD-10-CM | POA: Diagnosis not present

## 2016-12-08 DIAGNOSIS — Z139 Encounter for screening, unspecified: Secondary | ICD-10-CM | POA: Diagnosis not present

## 2016-12-08 DIAGNOSIS — R799 Abnormal finding of blood chemistry, unspecified: Secondary | ICD-10-CM | POA: Diagnosis not present

## 2016-12-08 DIAGNOSIS — E559 Vitamin D deficiency, unspecified: Secondary | ICD-10-CM | POA: Diagnosis not present

## 2016-12-08 DIAGNOSIS — E569 Vitamin deficiency, unspecified: Secondary | ICD-10-CM | POA: Diagnosis not present

## 2016-12-08 DIAGNOSIS — R5382 Chronic fatigue, unspecified: Secondary | ICD-10-CM | POA: Diagnosis not present

## 2016-12-08 DIAGNOSIS — Z780 Asymptomatic menopausal state: Secondary | ICD-10-CM | POA: Diagnosis not present

## 2016-12-29 DIAGNOSIS — M549 Dorsalgia, unspecified: Secondary | ICD-10-CM | POA: Diagnosis not present

## 2016-12-30 DIAGNOSIS — E559 Vitamin D deficiency, unspecified: Secondary | ICD-10-CM | POA: Diagnosis not present

## 2016-12-30 DIAGNOSIS — E039 Hypothyroidism, unspecified: Secondary | ICD-10-CM | POA: Diagnosis not present

## 2016-12-30 DIAGNOSIS — N951 Menopausal and female climacteric states: Secondary | ICD-10-CM | POA: Diagnosis not present

## 2017-01-06 DIAGNOSIS — J3489 Other specified disorders of nose and nasal sinuses: Secondary | ICD-10-CM | POA: Diagnosis not present

## 2017-01-07 DIAGNOSIS — M549 Dorsalgia, unspecified: Secondary | ICD-10-CM | POA: Diagnosis not present

## 2017-01-12 DIAGNOSIS — M549 Dorsalgia, unspecified: Secondary | ICD-10-CM | POA: Diagnosis not present

## 2017-01-19 DIAGNOSIS — M549 Dorsalgia, unspecified: Secondary | ICD-10-CM | POA: Diagnosis not present

## 2017-01-28 DIAGNOSIS — M549 Dorsalgia, unspecified: Secondary | ICD-10-CM | POA: Diagnosis not present

## 2017-02-09 DIAGNOSIS — M549 Dorsalgia, unspecified: Secondary | ICD-10-CM | POA: Diagnosis not present

## 2017-02-16 DIAGNOSIS — M549 Dorsalgia, unspecified: Secondary | ICD-10-CM | POA: Diagnosis not present

## 2017-02-23 DIAGNOSIS — M549 Dorsalgia, unspecified: Secondary | ICD-10-CM | POA: Diagnosis not present

## 2017-03-02 DIAGNOSIS — M549 Dorsalgia, unspecified: Secondary | ICD-10-CM | POA: Diagnosis not present

## 2017-03-09 DIAGNOSIS — M549 Dorsalgia, unspecified: Secondary | ICD-10-CM | POA: Diagnosis not present

## 2017-03-16 DIAGNOSIS — M549 Dorsalgia, unspecified: Secondary | ICD-10-CM | POA: Diagnosis not present

## 2017-03-17 DIAGNOSIS — J3089 Other allergic rhinitis: Secondary | ICD-10-CM | POA: Diagnosis not present

## 2017-03-17 DIAGNOSIS — M545 Low back pain: Secondary | ICD-10-CM | POA: Diagnosis not present

## 2017-03-17 DIAGNOSIS — M9901 Segmental and somatic dysfunction of cervical region: Secondary | ICD-10-CM | POA: Diagnosis not present

## 2017-03-17 DIAGNOSIS — M62830 Muscle spasm of back: Secondary | ICD-10-CM | POA: Diagnosis not present

## 2017-03-17 DIAGNOSIS — J301 Allergic rhinitis due to pollen: Secondary | ICD-10-CM | POA: Diagnosis not present

## 2017-03-17 DIAGNOSIS — M9903 Segmental and somatic dysfunction of lumbar region: Secondary | ICD-10-CM | POA: Diagnosis not present

## 2017-03-18 DIAGNOSIS — M62830 Muscle spasm of back: Secondary | ICD-10-CM | POA: Diagnosis not present

## 2017-03-18 DIAGNOSIS — M9903 Segmental and somatic dysfunction of lumbar region: Secondary | ICD-10-CM | POA: Diagnosis not present

## 2017-03-18 DIAGNOSIS — M545 Low back pain: Secondary | ICD-10-CM | POA: Diagnosis not present

## 2017-03-18 DIAGNOSIS — M9901 Segmental and somatic dysfunction of cervical region: Secondary | ICD-10-CM | POA: Diagnosis not present

## 2017-03-22 DIAGNOSIS — M62830 Muscle spasm of back: Secondary | ICD-10-CM | POA: Diagnosis not present

## 2017-03-22 DIAGNOSIS — M9901 Segmental and somatic dysfunction of cervical region: Secondary | ICD-10-CM | POA: Diagnosis not present

## 2017-03-22 DIAGNOSIS — M545 Low back pain: Secondary | ICD-10-CM | POA: Diagnosis not present

## 2017-03-22 DIAGNOSIS — M9903 Segmental and somatic dysfunction of lumbar region: Secondary | ICD-10-CM | POA: Diagnosis not present

## 2017-03-23 DIAGNOSIS — M549 Dorsalgia, unspecified: Secondary | ICD-10-CM | POA: Diagnosis not present

## 2017-03-24 DIAGNOSIS — Z780 Asymptomatic menopausal state: Secondary | ICD-10-CM | POA: Diagnosis not present

## 2017-03-24 DIAGNOSIS — E039 Hypothyroidism, unspecified: Secondary | ICD-10-CM | POA: Diagnosis not present

## 2017-03-24 DIAGNOSIS — M545 Low back pain: Secondary | ICD-10-CM | POA: Diagnosis not present

## 2017-03-24 DIAGNOSIS — R5382 Chronic fatigue, unspecified: Secondary | ICD-10-CM | POA: Diagnosis not present

## 2017-03-24 DIAGNOSIS — Z139 Encounter for screening, unspecified: Secondary | ICD-10-CM | POA: Diagnosis not present

## 2017-03-24 DIAGNOSIS — M9901 Segmental and somatic dysfunction of cervical region: Secondary | ICD-10-CM | POA: Diagnosis not present

## 2017-03-24 DIAGNOSIS — M62830 Muscle spasm of back: Secondary | ICD-10-CM | POA: Diagnosis not present

## 2017-03-24 DIAGNOSIS — E569 Vitamin deficiency, unspecified: Secondary | ICD-10-CM | POA: Diagnosis not present

## 2017-03-24 DIAGNOSIS — E559 Vitamin D deficiency, unspecified: Secondary | ICD-10-CM | POA: Diagnosis not present

## 2017-03-24 DIAGNOSIS — D510 Vitamin B12 deficiency anemia due to intrinsic factor deficiency: Secondary | ICD-10-CM | POA: Diagnosis not present

## 2017-03-24 DIAGNOSIS — R799 Abnormal finding of blood chemistry, unspecified: Secondary | ICD-10-CM | POA: Diagnosis not present

## 2017-03-24 DIAGNOSIS — M9903 Segmental and somatic dysfunction of lumbar region: Secondary | ICD-10-CM | POA: Diagnosis not present

## 2017-03-25 DIAGNOSIS — M545 Low back pain: Secondary | ICD-10-CM | POA: Diagnosis not present

## 2017-03-25 DIAGNOSIS — M9903 Segmental and somatic dysfunction of lumbar region: Secondary | ICD-10-CM | POA: Diagnosis not present

## 2017-03-25 DIAGNOSIS — M62830 Muscle spasm of back: Secondary | ICD-10-CM | POA: Diagnosis not present

## 2017-03-25 DIAGNOSIS — M9901 Segmental and somatic dysfunction of cervical region: Secondary | ICD-10-CM | POA: Diagnosis not present

## 2017-03-31 DIAGNOSIS — E039 Hypothyroidism, unspecified: Secondary | ICD-10-CM | POA: Diagnosis not present

## 2017-03-31 DIAGNOSIS — M549 Dorsalgia, unspecified: Secondary | ICD-10-CM | POA: Diagnosis not present

## 2017-03-31 DIAGNOSIS — N951 Menopausal and female climacteric states: Secondary | ICD-10-CM | POA: Diagnosis not present

## 2017-04-01 DIAGNOSIS — M9901 Segmental and somatic dysfunction of cervical region: Secondary | ICD-10-CM | POA: Diagnosis not present

## 2017-04-01 DIAGNOSIS — M545 Low back pain: Secondary | ICD-10-CM | POA: Diagnosis not present

## 2017-04-01 DIAGNOSIS — M9903 Segmental and somatic dysfunction of lumbar region: Secondary | ICD-10-CM | POA: Diagnosis not present

## 2017-04-01 DIAGNOSIS — M62830 Muscle spasm of back: Secondary | ICD-10-CM | POA: Diagnosis not present

## 2017-04-05 DIAGNOSIS — M9903 Segmental and somatic dysfunction of lumbar region: Secondary | ICD-10-CM | POA: Diagnosis not present

## 2017-04-05 DIAGNOSIS — M9901 Segmental and somatic dysfunction of cervical region: Secondary | ICD-10-CM | POA: Diagnosis not present

## 2017-04-05 DIAGNOSIS — M545 Low back pain: Secondary | ICD-10-CM | POA: Diagnosis not present

## 2017-04-05 DIAGNOSIS — M62830 Muscle spasm of back: Secondary | ICD-10-CM | POA: Diagnosis not present

## 2017-04-07 DIAGNOSIS — M545 Low back pain: Secondary | ICD-10-CM | POA: Diagnosis not present

## 2017-04-07 DIAGNOSIS — M9903 Segmental and somatic dysfunction of lumbar region: Secondary | ICD-10-CM | POA: Diagnosis not present

## 2017-04-07 DIAGNOSIS — M62830 Muscle spasm of back: Secondary | ICD-10-CM | POA: Diagnosis not present

## 2017-04-07 DIAGNOSIS — M9901 Segmental and somatic dysfunction of cervical region: Secondary | ICD-10-CM | POA: Diagnosis not present

## 2017-04-08 DIAGNOSIS — M549 Dorsalgia, unspecified: Secondary | ICD-10-CM | POA: Diagnosis not present

## 2017-04-22 DIAGNOSIS — M545 Low back pain: Secondary | ICD-10-CM | POA: Diagnosis not present

## 2017-04-22 DIAGNOSIS — M9903 Segmental and somatic dysfunction of lumbar region: Secondary | ICD-10-CM | POA: Diagnosis not present

## 2017-04-22 DIAGNOSIS — M9901 Segmental and somatic dysfunction of cervical region: Secondary | ICD-10-CM | POA: Diagnosis not present

## 2017-04-22 DIAGNOSIS — M62830 Muscle spasm of back: Secondary | ICD-10-CM | POA: Diagnosis not present

## 2017-06-04 DIAGNOSIS — J0100 Acute maxillary sinusitis, unspecified: Secondary | ICD-10-CM | POA: Diagnosis not present

## 2017-06-24 DIAGNOSIS — R799 Abnormal finding of blood chemistry, unspecified: Secondary | ICD-10-CM | POA: Diagnosis not present

## 2017-06-24 DIAGNOSIS — D510 Vitamin B12 deficiency anemia due to intrinsic factor deficiency: Secondary | ICD-10-CM | POA: Diagnosis not present

## 2017-06-24 DIAGNOSIS — E569 Vitamin deficiency, unspecified: Secondary | ICD-10-CM | POA: Diagnosis not present

## 2017-06-24 DIAGNOSIS — E039 Hypothyroidism, unspecified: Secondary | ICD-10-CM | POA: Diagnosis not present

## 2017-06-24 DIAGNOSIS — Z139 Encounter for screening, unspecified: Secondary | ICD-10-CM | POA: Diagnosis not present

## 2017-06-24 DIAGNOSIS — E559 Vitamin D deficiency, unspecified: Secondary | ICD-10-CM | POA: Diagnosis not present

## 2017-06-24 DIAGNOSIS — R5382 Chronic fatigue, unspecified: Secondary | ICD-10-CM | POA: Diagnosis not present

## 2017-06-29 DIAGNOSIS — R799 Abnormal finding of blood chemistry, unspecified: Secondary | ICD-10-CM | POA: Diagnosis not present

## 2017-06-29 DIAGNOSIS — E559 Vitamin D deficiency, unspecified: Secondary | ICD-10-CM | POA: Diagnosis not present

## 2017-06-29 DIAGNOSIS — Z139 Encounter for screening, unspecified: Secondary | ICD-10-CM | POA: Diagnosis not present

## 2017-06-29 DIAGNOSIS — D510 Vitamin B12 deficiency anemia due to intrinsic factor deficiency: Secondary | ICD-10-CM | POA: Diagnosis not present

## 2017-06-29 DIAGNOSIS — B2700 Gammaherpesviral mononucleosis without complication: Secondary | ICD-10-CM | POA: Diagnosis not present

## 2017-06-29 DIAGNOSIS — E569 Vitamin deficiency, unspecified: Secondary | ICD-10-CM | POA: Diagnosis not present

## 2017-06-29 DIAGNOSIS — R5382 Chronic fatigue, unspecified: Secondary | ICD-10-CM | POA: Diagnosis not present

## 2017-06-29 DIAGNOSIS — E039 Hypothyroidism, unspecified: Secondary | ICD-10-CM | POA: Diagnosis not present

## 2017-06-29 DIAGNOSIS — N951 Menopausal and female climacteric states: Secondary | ICD-10-CM | POA: Diagnosis not present

## 2017-07-06 DIAGNOSIS — R319 Hematuria, unspecified: Secondary | ICD-10-CM | POA: Diagnosis not present

## 2017-07-06 DIAGNOSIS — N762 Acute vulvitis: Secondary | ICD-10-CM | POA: Diagnosis not present

## 2017-07-20 ENCOUNTER — Other Ambulatory Visit: Payer: Self-pay | Admitting: Obstetrics and Gynecology

## 2017-07-20 DIAGNOSIS — Z1231 Encounter for screening mammogram for malignant neoplasm of breast: Secondary | ICD-10-CM

## 2017-07-21 ENCOUNTER — Other Ambulatory Visit: Payer: Self-pay | Admitting: Obstetrics and Gynecology

## 2017-07-21 DIAGNOSIS — N644 Mastodynia: Secondary | ICD-10-CM

## 2017-07-21 DIAGNOSIS — N6012 Diffuse cystic mastopathy of left breast: Secondary | ICD-10-CM

## 2017-07-22 DIAGNOSIS — M545 Low back pain: Secondary | ICD-10-CM | POA: Diagnosis not present

## 2017-07-22 DIAGNOSIS — M9901 Segmental and somatic dysfunction of cervical region: Secondary | ICD-10-CM | POA: Diagnosis not present

## 2017-07-22 DIAGNOSIS — M62830 Muscle spasm of back: Secondary | ICD-10-CM | POA: Diagnosis not present

## 2017-07-22 DIAGNOSIS — M9903 Segmental and somatic dysfunction of lumbar region: Secondary | ICD-10-CM | POA: Diagnosis not present

## 2017-07-23 ENCOUNTER — Other Ambulatory Visit: Payer: PPO

## 2017-07-26 ENCOUNTER — Ambulatory Visit
Admission: RE | Admit: 2017-07-26 | Discharge: 2017-07-26 | Disposition: A | Discharge status: Home / Self Care | Payer: PPO | Source: Ambulatory Visit | Attending: Obstetrics and Gynecology | Admitting: Obstetrics and Gynecology

## 2017-07-26 DIAGNOSIS — M62830 Muscle spasm of back: Secondary | ICD-10-CM | POA: Diagnosis not present

## 2017-07-26 DIAGNOSIS — M9903 Segmental and somatic dysfunction of lumbar region: Secondary | ICD-10-CM | POA: Diagnosis not present

## 2017-07-26 DIAGNOSIS — N644 Mastodynia: Secondary | ICD-10-CM

## 2017-07-26 DIAGNOSIS — M9901 Segmental and somatic dysfunction of cervical region: Secondary | ICD-10-CM | POA: Diagnosis not present

## 2017-07-26 DIAGNOSIS — N6012 Diffuse cystic mastopathy of left breast: Secondary | ICD-10-CM

## 2017-07-26 DIAGNOSIS — R928 Other abnormal and inconclusive findings on diagnostic imaging of breast: Secondary | ICD-10-CM | POA: Diagnosis not present

## 2017-07-26 DIAGNOSIS — M545 Low back pain: Secondary | ICD-10-CM | POA: Diagnosis not present

## 2017-07-26 DIAGNOSIS — N6489 Other specified disorders of breast: Secondary | ICD-10-CM | POA: Diagnosis not present

## 2017-07-28 DIAGNOSIS — M9901 Segmental and somatic dysfunction of cervical region: Secondary | ICD-10-CM | POA: Diagnosis not present

## 2017-07-28 DIAGNOSIS — M9903 Segmental and somatic dysfunction of lumbar region: Secondary | ICD-10-CM | POA: Diagnosis not present

## 2017-07-28 DIAGNOSIS — M62830 Muscle spasm of back: Secondary | ICD-10-CM | POA: Diagnosis not present

## 2017-07-28 DIAGNOSIS — M545 Low back pain: Secondary | ICD-10-CM | POA: Diagnosis not present

## 2017-08-03 DIAGNOSIS — M9903 Segmental and somatic dysfunction of lumbar region: Secondary | ICD-10-CM | POA: Diagnosis not present

## 2017-08-03 DIAGNOSIS — M62830 Muscle spasm of back: Secondary | ICD-10-CM | POA: Diagnosis not present

## 2017-08-03 DIAGNOSIS — Z85828 Personal history of other malignant neoplasm of skin: Secondary | ICD-10-CM | POA: Diagnosis not present

## 2017-08-03 DIAGNOSIS — M9901 Segmental and somatic dysfunction of cervical region: Secondary | ICD-10-CM | POA: Diagnosis not present

## 2017-08-03 DIAGNOSIS — L821 Other seborrheic keratosis: Secondary | ICD-10-CM | POA: Diagnosis not present

## 2017-08-03 DIAGNOSIS — L812 Freckles: Secondary | ICD-10-CM | POA: Diagnosis not present

## 2017-08-03 DIAGNOSIS — C44712 Basal cell carcinoma of skin of right lower limb, including hip: Secondary | ICD-10-CM | POA: Diagnosis not present

## 2017-08-03 DIAGNOSIS — D1801 Hemangioma of skin and subcutaneous tissue: Secondary | ICD-10-CM | POA: Diagnosis not present

## 2017-08-03 DIAGNOSIS — M545 Low back pain: Secondary | ICD-10-CM | POA: Diagnosis not present

## 2017-08-03 DIAGNOSIS — L718 Other rosacea: Secondary | ICD-10-CM | POA: Diagnosis not present

## 2017-08-03 DIAGNOSIS — D485 Neoplasm of uncertain behavior of skin: Secondary | ICD-10-CM | POA: Diagnosis not present

## 2017-08-05 DIAGNOSIS — M9903 Segmental and somatic dysfunction of lumbar region: Secondary | ICD-10-CM | POA: Diagnosis not present

## 2017-08-05 DIAGNOSIS — M9901 Segmental and somatic dysfunction of cervical region: Secondary | ICD-10-CM | POA: Diagnosis not present

## 2017-08-05 DIAGNOSIS — M62830 Muscle spasm of back: Secondary | ICD-10-CM | POA: Diagnosis not present

## 2017-08-05 DIAGNOSIS — M545 Low back pain: Secondary | ICD-10-CM | POA: Diagnosis not present

## 2017-08-10 DIAGNOSIS — M545 Low back pain: Secondary | ICD-10-CM | POA: Diagnosis not present

## 2017-08-10 DIAGNOSIS — M62830 Muscle spasm of back: Secondary | ICD-10-CM | POA: Diagnosis not present

## 2017-08-10 DIAGNOSIS — M9903 Segmental and somatic dysfunction of lumbar region: Secondary | ICD-10-CM | POA: Diagnosis not present

## 2017-08-10 DIAGNOSIS — M9901 Segmental and somatic dysfunction of cervical region: Secondary | ICD-10-CM | POA: Diagnosis not present

## 2017-09-01 DIAGNOSIS — I83813 Varicose veins of bilateral lower extremities with pain: Secondary | ICD-10-CM | POA: Diagnosis not present

## 2017-09-01 DIAGNOSIS — I8312 Varicose veins of left lower extremity with inflammation: Secondary | ICD-10-CM | POA: Diagnosis not present

## 2017-09-01 DIAGNOSIS — I8311 Varicose veins of right lower extremity with inflammation: Secondary | ICD-10-CM | POA: Diagnosis not present

## 2017-09-03 DIAGNOSIS — Z85828 Personal history of other malignant neoplasm of skin: Secondary | ICD-10-CM | POA: Diagnosis not present

## 2017-09-03 DIAGNOSIS — L03115 Cellulitis of right lower limb: Secondary | ICD-10-CM | POA: Diagnosis not present

## 2017-09-17 DIAGNOSIS — R799 Abnormal finding of blood chemistry, unspecified: Secondary | ICD-10-CM | POA: Diagnosis not present

## 2017-09-17 DIAGNOSIS — E039 Hypothyroidism, unspecified: Secondary | ICD-10-CM | POA: Diagnosis not present

## 2017-09-17 DIAGNOSIS — Z780 Asymptomatic menopausal state: Secondary | ICD-10-CM | POA: Diagnosis not present

## 2017-09-17 DIAGNOSIS — E559 Vitamin D deficiency, unspecified: Secondary | ICD-10-CM | POA: Diagnosis not present

## 2017-09-17 DIAGNOSIS — D510 Vitamin B12 deficiency anemia due to intrinsic factor deficiency: Secondary | ICD-10-CM | POA: Diagnosis not present

## 2017-09-17 DIAGNOSIS — E569 Vitamin deficiency, unspecified: Secondary | ICD-10-CM | POA: Diagnosis not present

## 2017-09-17 DIAGNOSIS — R5382 Chronic fatigue, unspecified: Secondary | ICD-10-CM | POA: Diagnosis not present

## 2017-09-21 DIAGNOSIS — I8311 Varicose veins of right lower extremity with inflammation: Secondary | ICD-10-CM | POA: Diagnosis not present

## 2017-09-21 DIAGNOSIS — I8312 Varicose veins of left lower extremity with inflammation: Secondary | ICD-10-CM | POA: Diagnosis not present

## 2017-09-21 DIAGNOSIS — I83813 Varicose veins of bilateral lower extremities with pain: Secondary | ICD-10-CM | POA: Diagnosis not present

## 2017-09-21 DIAGNOSIS — I83893 Varicose veins of bilateral lower extremities with other complications: Secondary | ICD-10-CM | POA: Diagnosis not present

## 2017-09-29 DIAGNOSIS — R5382 Chronic fatigue, unspecified: Secondary | ICD-10-CM | POA: Diagnosis not present

## 2017-09-29 DIAGNOSIS — D510 Vitamin B12 deficiency anemia due to intrinsic factor deficiency: Secondary | ICD-10-CM | POA: Diagnosis not present

## 2017-09-29 DIAGNOSIS — R799 Abnormal finding of blood chemistry, unspecified: Secondary | ICD-10-CM | POA: Diagnosis not present

## 2017-09-29 DIAGNOSIS — Z780 Asymptomatic menopausal state: Secondary | ICD-10-CM | POA: Diagnosis not present

## 2017-09-29 DIAGNOSIS — B2700 Gammaherpesviral mononucleosis without complication: Secondary | ICD-10-CM | POA: Diagnosis not present

## 2017-09-29 DIAGNOSIS — E785 Hyperlipidemia, unspecified: Secondary | ICD-10-CM | POA: Diagnosis not present

## 2017-09-29 DIAGNOSIS — E559 Vitamin D deficiency, unspecified: Secondary | ICD-10-CM | POA: Diagnosis not present

## 2017-09-29 DIAGNOSIS — E039 Hypothyroidism, unspecified: Secondary | ICD-10-CM | POA: Diagnosis not present

## 2017-09-29 DIAGNOSIS — N951 Menopausal and female climacteric states: Secondary | ICD-10-CM | POA: Diagnosis not present

## 2017-09-29 DIAGNOSIS — E569 Vitamin deficiency, unspecified: Secondary | ICD-10-CM | POA: Diagnosis not present

## 2017-10-07 DIAGNOSIS — I8311 Varicose veins of right lower extremity with inflammation: Secondary | ICD-10-CM | POA: Diagnosis not present

## 2017-10-07 DIAGNOSIS — I83813 Varicose veins of bilateral lower extremities with pain: Secondary | ICD-10-CM | POA: Diagnosis not present

## 2017-10-07 DIAGNOSIS — I83893 Varicose veins of bilateral lower extremities with other complications: Secondary | ICD-10-CM | POA: Diagnosis not present

## 2017-10-07 DIAGNOSIS — I8312 Varicose veins of left lower extremity with inflammation: Secondary | ICD-10-CM | POA: Diagnosis not present

## 2017-10-26 DIAGNOSIS — E039 Hypothyroidism, unspecified: Secondary | ICD-10-CM | POA: Diagnosis not present

## 2017-11-03 DIAGNOSIS — I83891 Varicose veins of right lower extremities with other complications: Secondary | ICD-10-CM | POA: Diagnosis not present

## 2017-11-03 DIAGNOSIS — I8311 Varicose veins of right lower extremity with inflammation: Secondary | ICD-10-CM | POA: Diagnosis not present

## 2017-11-05 DIAGNOSIS — I83891 Varicose veins of right lower extremities with other complications: Secondary | ICD-10-CM | POA: Diagnosis not present

## 2017-11-05 DIAGNOSIS — I8311 Varicose veins of right lower extremity with inflammation: Secondary | ICD-10-CM | POA: Diagnosis not present

## 2017-11-15 DIAGNOSIS — M545 Low back pain: Secondary | ICD-10-CM | POA: Diagnosis not present

## 2017-11-17 DIAGNOSIS — L659 Nonscarring hair loss, unspecified: Secondary | ICD-10-CM | POA: Diagnosis not present

## 2017-11-17 DIAGNOSIS — E039 Hypothyroidism, unspecified: Secondary | ICD-10-CM | POA: Diagnosis not present

## 2017-11-18 DIAGNOSIS — L658 Other specified nonscarring hair loss: Secondary | ICD-10-CM | POA: Diagnosis not present

## 2017-11-29 DIAGNOSIS — I83811 Varicose veins of right lower extremities with pain: Secondary | ICD-10-CM | POA: Diagnosis not present

## 2017-11-29 DIAGNOSIS — M2170 Unequal limb length (acquired), unspecified site: Secondary | ICD-10-CM | POA: Diagnosis not present

## 2017-11-29 DIAGNOSIS — M545 Low back pain: Secondary | ICD-10-CM | POA: Diagnosis not present

## 2017-11-29 DIAGNOSIS — I8311 Varicose veins of right lower extremity with inflammation: Secondary | ICD-10-CM | POA: Diagnosis not present

## 2017-12-01 DIAGNOSIS — Z6826 Body mass index (BMI) 26.0-26.9, adult: Secondary | ICD-10-CM | POA: Diagnosis not present

## 2017-12-01 DIAGNOSIS — Z780 Asymptomatic menopausal state: Secondary | ICD-10-CM | POA: Diagnosis not present

## 2017-12-01 DIAGNOSIS — L659 Nonscarring hair loss, unspecified: Secondary | ICD-10-CM | POA: Diagnosis not present

## 2017-12-01 DIAGNOSIS — R5382 Chronic fatigue, unspecified: Secondary | ICD-10-CM | POA: Diagnosis not present

## 2017-12-01 DIAGNOSIS — E559 Vitamin D deficiency, unspecified: Secondary | ICD-10-CM | POA: Diagnosis not present

## 2017-12-01 DIAGNOSIS — D510 Vitamin B12 deficiency anemia due to intrinsic factor deficiency: Secondary | ICD-10-CM | POA: Diagnosis not present

## 2017-12-01 DIAGNOSIS — E039 Hypothyroidism, unspecified: Secondary | ICD-10-CM | POA: Diagnosis not present

## 2017-12-01 DIAGNOSIS — R799 Abnormal finding of blood chemistry, unspecified: Secondary | ICD-10-CM | POA: Diagnosis not present

## 2017-12-01 DIAGNOSIS — E569 Vitamin deficiency, unspecified: Secondary | ICD-10-CM | POA: Diagnosis not present

## 2017-12-06 DIAGNOSIS — M545 Low back pain: Secondary | ICD-10-CM | POA: Diagnosis not present

## 2017-12-06 DIAGNOSIS — M2170 Unequal limb length (acquired), unspecified site: Secondary | ICD-10-CM | POA: Diagnosis not present

## 2017-12-07 DIAGNOSIS — N951 Menopausal and female climacteric states: Secondary | ICD-10-CM | POA: Diagnosis not present

## 2017-12-07 DIAGNOSIS — E039 Hypothyroidism, unspecified: Secondary | ICD-10-CM | POA: Diagnosis not present

## 2017-12-14 DIAGNOSIS — M545 Low back pain: Secondary | ICD-10-CM | POA: Diagnosis not present

## 2017-12-14 DIAGNOSIS — M2170 Unequal limb length (acquired), unspecified site: Secondary | ICD-10-CM | POA: Diagnosis not present

## 2017-12-15 DIAGNOSIS — I8311 Varicose veins of right lower extremity with inflammation: Secondary | ICD-10-CM | POA: Diagnosis not present

## 2017-12-15 DIAGNOSIS — I83891 Varicose veins of right lower extremities with other complications: Secondary | ICD-10-CM | POA: Diagnosis not present

## 2017-12-21 DIAGNOSIS — M2170 Unequal limb length (acquired), unspecified site: Secondary | ICD-10-CM | POA: Diagnosis not present

## 2017-12-21 DIAGNOSIS — M545 Low back pain: Secondary | ICD-10-CM | POA: Diagnosis not present

## 2017-12-25 DIAGNOSIS — H01006 Unspecified blepharitis left eye, unspecified eyelid: Secondary | ICD-10-CM | POA: Diagnosis not present

## 2017-12-27 DIAGNOSIS — J301 Allergic rhinitis due to pollen: Secondary | ICD-10-CM | POA: Diagnosis not present

## 2017-12-27 DIAGNOSIS — J3089 Other allergic rhinitis: Secondary | ICD-10-CM | POA: Diagnosis not present

## 2017-12-27 DIAGNOSIS — I8312 Varicose veins of left lower extremity with inflammation: Secondary | ICD-10-CM | POA: Diagnosis not present

## 2017-12-27 DIAGNOSIS — L251 Unspecified contact dermatitis due to drugs in contact with skin: Secondary | ICD-10-CM | POA: Diagnosis not present

## 2017-12-27 DIAGNOSIS — I8311 Varicose veins of right lower extremity with inflammation: Secondary | ICD-10-CM | POA: Diagnosis not present

## 2017-12-30 DIAGNOSIS — E039 Hypothyroidism, unspecified: Secondary | ICD-10-CM | POA: Diagnosis not present

## 2017-12-31 DIAGNOSIS — L233 Allergic contact dermatitis due to drugs in contact with skin: Secondary | ICD-10-CM | POA: Diagnosis not present

## 2018-01-19 DIAGNOSIS — M2170 Unequal limb length (acquired), unspecified site: Secondary | ICD-10-CM | POA: Diagnosis not present

## 2018-01-19 DIAGNOSIS — M545 Low back pain: Secondary | ICD-10-CM | POA: Diagnosis not present

## 2018-01-25 DIAGNOSIS — Z23 Encounter for immunization: Secondary | ICD-10-CM | POA: Diagnosis not present

## 2018-01-25 DIAGNOSIS — L659 Nonscarring hair loss, unspecified: Secondary | ICD-10-CM | POA: Diagnosis not present

## 2018-01-25 DIAGNOSIS — Z6826 Body mass index (BMI) 26.0-26.9, adult: Secondary | ICD-10-CM | POA: Diagnosis not present

## 2018-01-25 DIAGNOSIS — E039 Hypothyroidism, unspecified: Secondary | ICD-10-CM | POA: Diagnosis not present

## 2018-01-25 DIAGNOSIS — I8312 Varicose veins of left lower extremity with inflammation: Secondary | ICD-10-CM | POA: Diagnosis not present

## 2018-01-25 DIAGNOSIS — I8311 Varicose veins of right lower extremity with inflammation: Secondary | ICD-10-CM | POA: Diagnosis not present

## 2018-02-01 DIAGNOSIS — L658 Other specified nonscarring hair loss: Secondary | ICD-10-CM | POA: Diagnosis not present

## 2018-02-01 DIAGNOSIS — M545 Low back pain: Secondary | ICD-10-CM | POA: Diagnosis not present

## 2018-02-01 DIAGNOSIS — M2170 Unequal limb length (acquired), unspecified site: Secondary | ICD-10-CM | POA: Diagnosis not present

## 2018-02-10 DIAGNOSIS — M7981 Nontraumatic hematoma of soft tissue: Secondary | ICD-10-CM | POA: Diagnosis not present

## 2018-02-10 DIAGNOSIS — I8312 Varicose veins of left lower extremity with inflammation: Secondary | ICD-10-CM | POA: Diagnosis not present

## 2018-02-14 DIAGNOSIS — J302 Other seasonal allergic rhinitis: Secondary | ICD-10-CM | POA: Diagnosis not present

## 2018-02-14 DIAGNOSIS — Z974 Presence of external hearing-aid: Secondary | ICD-10-CM | POA: Diagnosis not present

## 2018-02-14 DIAGNOSIS — Z7289 Other problems related to lifestyle: Secondary | ICD-10-CM | POA: Diagnosis not present

## 2018-02-14 DIAGNOSIS — H9193 Unspecified hearing loss, bilateral: Secondary | ICD-10-CM | POA: Diagnosis not present

## 2018-02-14 DIAGNOSIS — T161XXA Foreign body in right ear, initial encounter: Secondary | ICD-10-CM | POA: Insufficient documentation

## 2018-02-17 DIAGNOSIS — M2170 Unequal limb length (acquired), unspecified site: Secondary | ICD-10-CM | POA: Diagnosis not present

## 2018-02-17 DIAGNOSIS — M545 Low back pain: Secondary | ICD-10-CM | POA: Diagnosis not present

## 2018-02-25 DIAGNOSIS — L821 Other seborrheic keratosis: Secondary | ICD-10-CM | POA: Diagnosis not present

## 2018-02-25 DIAGNOSIS — D225 Melanocytic nevi of trunk: Secondary | ICD-10-CM | POA: Diagnosis not present

## 2018-02-25 DIAGNOSIS — Z85828 Personal history of other malignant neoplasm of skin: Secondary | ICD-10-CM | POA: Diagnosis not present

## 2018-02-25 DIAGNOSIS — L812 Freckles: Secondary | ICD-10-CM | POA: Diagnosis not present

## 2018-02-25 DIAGNOSIS — D1801 Hemangioma of skin and subcutaneous tissue: Secondary | ICD-10-CM | POA: Diagnosis not present

## 2018-03-01 DIAGNOSIS — Z780 Asymptomatic menopausal state: Secondary | ICD-10-CM | POA: Diagnosis not present

## 2018-03-01 DIAGNOSIS — R799 Abnormal finding of blood chemistry, unspecified: Secondary | ICD-10-CM | POA: Diagnosis not present

## 2018-03-01 DIAGNOSIS — E569 Vitamin deficiency, unspecified: Secondary | ICD-10-CM | POA: Diagnosis not present

## 2018-03-01 DIAGNOSIS — D510 Vitamin B12 deficiency anemia due to intrinsic factor deficiency: Secondary | ICD-10-CM | POA: Diagnosis not present

## 2018-03-01 DIAGNOSIS — E039 Hypothyroidism, unspecified: Secondary | ICD-10-CM | POA: Diagnosis not present

## 2018-03-01 DIAGNOSIS — R5382 Chronic fatigue, unspecified: Secondary | ICD-10-CM | POA: Diagnosis not present

## 2018-03-04 DIAGNOSIS — L658 Other specified nonscarring hair loss: Secondary | ICD-10-CM | POA: Diagnosis not present

## 2018-03-04 DIAGNOSIS — L819 Disorder of pigmentation, unspecified: Secondary | ICD-10-CM | POA: Diagnosis not present

## 2018-03-08 DIAGNOSIS — M545 Low back pain: Secondary | ICD-10-CM | POA: Diagnosis not present

## 2018-03-08 DIAGNOSIS — M2170 Unequal limb length (acquired), unspecified site: Secondary | ICD-10-CM | POA: Diagnosis not present

## 2018-03-09 DIAGNOSIS — E785 Hyperlipidemia, unspecified: Secondary | ICD-10-CM | POA: Diagnosis not present

## 2018-03-09 DIAGNOSIS — N951 Menopausal and female climacteric states: Secondary | ICD-10-CM | POA: Diagnosis not present

## 2018-03-09 DIAGNOSIS — E039 Hypothyroidism, unspecified: Secondary | ICD-10-CM | POA: Diagnosis not present

## 2018-03-10 DIAGNOSIS — I8311 Varicose veins of right lower extremity with inflammation: Secondary | ICD-10-CM | POA: Diagnosis not present

## 2018-03-10 DIAGNOSIS — M7981 Nontraumatic hematoma of soft tissue: Secondary | ICD-10-CM | POA: Diagnosis not present

## 2018-03-22 DIAGNOSIS — M2170 Unequal limb length (acquired), unspecified site: Secondary | ICD-10-CM | POA: Diagnosis not present

## 2018-03-22 DIAGNOSIS — M545 Low back pain: Secondary | ICD-10-CM | POA: Diagnosis not present

## 2018-03-29 DIAGNOSIS — T161XXA Foreign body in right ear, initial encounter: Secondary | ICD-10-CM | POA: Diagnosis not present

## 2018-03-29 DIAGNOSIS — H903 Sensorineural hearing loss, bilateral: Secondary | ICD-10-CM | POA: Diagnosis not present

## 2018-04-04 DIAGNOSIS — M9903 Segmental and somatic dysfunction of lumbar region: Secondary | ICD-10-CM | POA: Diagnosis not present

## 2018-04-04 DIAGNOSIS — M62830 Muscle spasm of back: Secondary | ICD-10-CM | POA: Diagnosis not present

## 2018-04-04 DIAGNOSIS — M9901 Segmental and somatic dysfunction of cervical region: Secondary | ICD-10-CM | POA: Diagnosis not present

## 2018-04-04 DIAGNOSIS — M545 Low back pain: Secondary | ICD-10-CM | POA: Diagnosis not present

## 2018-04-18 DIAGNOSIS — M9903 Segmental and somatic dysfunction of lumbar region: Secondary | ICD-10-CM | POA: Diagnosis not present

## 2018-04-18 DIAGNOSIS — M545 Low back pain: Secondary | ICD-10-CM | POA: Diagnosis not present

## 2018-04-18 DIAGNOSIS — M62830 Muscle spasm of back: Secondary | ICD-10-CM | POA: Diagnosis not present

## 2018-04-18 DIAGNOSIS — M9901 Segmental and somatic dysfunction of cervical region: Secondary | ICD-10-CM | POA: Diagnosis not present

## 2018-04-21 DIAGNOSIS — H903 Sensorineural hearing loss, bilateral: Secondary | ICD-10-CM | POA: Diagnosis not present

## 2018-04-25 DIAGNOSIS — M545 Low back pain: Secondary | ICD-10-CM | POA: Diagnosis not present

## 2018-04-25 DIAGNOSIS — M9903 Segmental and somatic dysfunction of lumbar region: Secondary | ICD-10-CM | POA: Diagnosis not present

## 2018-04-25 DIAGNOSIS — M62830 Muscle spasm of back: Secondary | ICD-10-CM | POA: Diagnosis not present

## 2018-04-25 DIAGNOSIS — M9901 Segmental and somatic dysfunction of cervical region: Secondary | ICD-10-CM | POA: Diagnosis not present

## 2018-04-26 DIAGNOSIS — D485 Neoplasm of uncertain behavior of skin: Secondary | ICD-10-CM | POA: Diagnosis not present

## 2018-04-26 DIAGNOSIS — C44329 Squamous cell carcinoma of skin of other parts of face: Secondary | ICD-10-CM | POA: Diagnosis not present

## 2018-04-26 DIAGNOSIS — M7981 Nontraumatic hematoma of soft tissue: Secondary | ICD-10-CM | POA: Diagnosis not present

## 2018-04-28 DIAGNOSIS — M62830 Muscle spasm of back: Secondary | ICD-10-CM | POA: Diagnosis not present

## 2018-04-28 DIAGNOSIS — M545 Low back pain: Secondary | ICD-10-CM | POA: Diagnosis not present

## 2018-04-28 DIAGNOSIS — M9901 Segmental and somatic dysfunction of cervical region: Secondary | ICD-10-CM | POA: Diagnosis not present

## 2018-04-28 DIAGNOSIS — M9903 Segmental and somatic dysfunction of lumbar region: Secondary | ICD-10-CM | POA: Diagnosis not present

## 2018-05-02 DIAGNOSIS — M545 Low back pain: Secondary | ICD-10-CM | POA: Diagnosis not present

## 2018-05-02 DIAGNOSIS — M9901 Segmental and somatic dysfunction of cervical region: Secondary | ICD-10-CM | POA: Diagnosis not present

## 2018-05-02 DIAGNOSIS — M62830 Muscle spasm of back: Secondary | ICD-10-CM | POA: Diagnosis not present

## 2018-05-02 DIAGNOSIS — M9903 Segmental and somatic dysfunction of lumbar region: Secondary | ICD-10-CM | POA: Diagnosis not present

## 2018-05-16 DIAGNOSIS — C44329 Squamous cell carcinoma of skin of other parts of face: Secondary | ICD-10-CM | POA: Diagnosis not present

## 2018-05-17 DIAGNOSIS — M545 Low back pain: Secondary | ICD-10-CM | POA: Diagnosis not present

## 2018-05-17 DIAGNOSIS — M9903 Segmental and somatic dysfunction of lumbar region: Secondary | ICD-10-CM | POA: Diagnosis not present

## 2018-05-17 DIAGNOSIS — M9901 Segmental and somatic dysfunction of cervical region: Secondary | ICD-10-CM | POA: Diagnosis not present

## 2018-05-17 DIAGNOSIS — M62830 Muscle spasm of back: Secondary | ICD-10-CM | POA: Diagnosis not present

## 2018-06-03 DIAGNOSIS — E7212 Methylenetetrahydrofolate reductase deficiency: Secondary | ICD-10-CM | POA: Diagnosis not present

## 2018-06-03 DIAGNOSIS — E785 Hyperlipidemia, unspecified: Secondary | ICD-10-CM | POA: Diagnosis not present

## 2018-06-03 DIAGNOSIS — E559 Vitamin D deficiency, unspecified: Secondary | ICD-10-CM | POA: Diagnosis not present

## 2018-06-03 DIAGNOSIS — E039 Hypothyroidism, unspecified: Secondary | ICD-10-CM | POA: Diagnosis not present

## 2018-06-03 DIAGNOSIS — Z780 Asymptomatic menopausal state: Secondary | ICD-10-CM | POA: Diagnosis not present

## 2018-06-17 DIAGNOSIS — H43811 Vitreous degeneration, right eye: Secondary | ICD-10-CM | POA: Diagnosis not present

## 2018-07-26 DIAGNOSIS — F1120 Opioid dependence, uncomplicated: Secondary | ICD-10-CM | POA: Diagnosis not present

## 2018-07-26 DIAGNOSIS — Z0000 Encounter for general adult medical examination without abnormal findings: Secondary | ICD-10-CM | POA: Diagnosis not present

## 2018-07-26 DIAGNOSIS — I10 Essential (primary) hypertension: Secondary | ICD-10-CM | POA: Diagnosis not present

## 2018-07-26 DIAGNOSIS — Z1339 Encounter for screening examination for other mental health and behavioral disorders: Secondary | ICD-10-CM | POA: Diagnosis not present

## 2018-07-26 DIAGNOSIS — G894 Chronic pain syndrome: Secondary | ICD-10-CM | POA: Diagnosis not present

## 2018-07-26 DIAGNOSIS — M47816 Spondylosis without myelopathy or radiculopathy, lumbar region: Secondary | ICD-10-CM | POA: Diagnosis not present

## 2018-07-26 DIAGNOSIS — M5136 Other intervertebral disc degeneration, lumbar region: Secondary | ICD-10-CM | POA: Diagnosis not present

## 2018-07-26 DIAGNOSIS — Z1331 Encounter for screening for depression: Secondary | ICD-10-CM | POA: Diagnosis not present

## 2018-07-26 DIAGNOSIS — I6381 Other cerebral infarction due to occlusion or stenosis of small artery: Secondary | ICD-10-CM | POA: Diagnosis not present

## 2018-07-26 DIAGNOSIS — Z6827 Body mass index (BMI) 27.0-27.9, adult: Secondary | ICD-10-CM | POA: Diagnosis not present

## 2018-07-26 DIAGNOSIS — Z1211 Encounter for screening for malignant neoplasm of colon: Secondary | ICD-10-CM | POA: Diagnosis not present

## 2018-07-26 DIAGNOSIS — F0150 Vascular dementia without behavioral disturbance: Secondary | ICD-10-CM | POA: Diagnosis not present

## 2018-07-26 DIAGNOSIS — Z299 Encounter for prophylactic measures, unspecified: Secondary | ICD-10-CM | POA: Diagnosis not present

## 2018-07-26 DIAGNOSIS — Z7189 Other specified counseling: Secondary | ICD-10-CM | POA: Diagnosis not present

## 2018-07-26 DIAGNOSIS — R5383 Other fatigue: Secondary | ICD-10-CM | POA: Diagnosis not present

## 2018-07-26 DIAGNOSIS — E039 Hypothyroidism, unspecified: Secondary | ICD-10-CM | POA: Diagnosis not present

## 2018-09-02 DIAGNOSIS — R5382 Chronic fatigue, unspecified: Secondary | ICD-10-CM | POA: Diagnosis not present

## 2018-09-02 DIAGNOSIS — L659 Nonscarring hair loss, unspecified: Secondary | ICD-10-CM | POA: Diagnosis not present

## 2018-09-02 DIAGNOSIS — E785 Hyperlipidemia, unspecified: Secondary | ICD-10-CM | POA: Diagnosis not present

## 2018-09-02 DIAGNOSIS — Z780 Asymptomatic menopausal state: Secondary | ICD-10-CM | POA: Diagnosis not present

## 2018-09-02 DIAGNOSIS — E039 Hypothyroidism, unspecified: Secondary | ICD-10-CM | POA: Diagnosis not present

## 2018-09-02 DIAGNOSIS — R799 Abnormal finding of blood chemistry, unspecified: Secondary | ICD-10-CM | POA: Diagnosis not present

## 2018-09-26 DIAGNOSIS — J029 Acute pharyngitis, unspecified: Secondary | ICD-10-CM | POA: Diagnosis not present

## 2018-09-26 DIAGNOSIS — R51 Headache: Secondary | ICD-10-CM | POA: Diagnosis not present

## 2018-09-27 DIAGNOSIS — Z03818 Encounter for observation for suspected exposure to other biological agents ruled out: Secondary | ICD-10-CM | POA: Diagnosis not present

## 2018-10-27 DIAGNOSIS — M9902 Segmental and somatic dysfunction of thoracic region: Secondary | ICD-10-CM | POA: Diagnosis not present

## 2018-10-27 DIAGNOSIS — M9903 Segmental and somatic dysfunction of lumbar region: Secondary | ICD-10-CM | POA: Diagnosis not present

## 2018-10-27 DIAGNOSIS — M9905 Segmental and somatic dysfunction of pelvic region: Secondary | ICD-10-CM | POA: Diagnosis not present

## 2018-10-27 DIAGNOSIS — M62830 Muscle spasm of back: Secondary | ICD-10-CM | POA: Diagnosis not present

## 2018-11-08 ENCOUNTER — Ambulatory Visit: Payer: PPO | Admitting: Podiatry

## 2018-11-08 ENCOUNTER — Ambulatory Visit (INDEPENDENT_AMBULATORY_CARE_PROVIDER_SITE_OTHER): Payer: PPO

## 2018-11-08 ENCOUNTER — Encounter: Payer: Self-pay | Admitting: Podiatry

## 2018-11-08 ENCOUNTER — Other Ambulatory Visit: Payer: Self-pay

## 2018-11-08 VITALS — BP 146/77 | HR 72 | Temp 97.8°F | Resp 16

## 2018-11-08 DIAGNOSIS — S92344A Nondisplaced fracture of fourth metatarsal bone, right foot, initial encounter for closed fracture: Secondary | ICD-10-CM

## 2018-11-08 DIAGNOSIS — S9031XA Contusion of right foot, initial encounter: Secondary | ICD-10-CM | POA: Diagnosis not present

## 2018-11-09 ENCOUNTER — Encounter: Payer: Self-pay | Admitting: Podiatry

## 2018-11-09 NOTE — Progress Notes (Signed)
  Subjective:  Patient ID: Kirsten Jones, female    DOB: April 02, 1951,  MRN: [id number] HPI Chief Complaint  Patient presents with  . Foot Injury    Dorsal forefoot right - jammed foot into big water bottle x 10 days ago, was really swollen and bruised, which is better today, but foot is still very sore  . New Patient (Initial Visit)    Est pt 23    67 y.o. female presents with the above complaint.   ROS: Denies fever chills nausea vomiting muscle aches pains calf pain back pain chest pain shortness of breath.  No past medical history on file. No past surgical history on file.  Current Outpatient Medications:  .  Omega-3 Fatty Acids (FISH OIL PO), Take by mouth., Disp: , Rfl:  .  PRESCRIPTION MEDICATION, "Hormones", Disp: , Rfl:  .  ARMOUR THYROID 60 MG tablet, , Disp: , Rfl:  .  levocetirizine (XYZAL) 5 MG tablet, TAKE 1 TABLET BY MOUTH ONCE DAILY IN THE EVENING FOR 90 DAYS, Disp: , Rfl:   No Known Allergies Review of Systems Objective:   Vitals:   11/08/18 1613  BP: (!) 146/77  Pulse: 72  Resp: 16  Temp: 97.8 F (36.6 C)    General: Well developed, nourished, in no acute distress, alert and oriented x3   Dermatological: Skin is warm, dry and supple bilateral. Nails x 10 are well maintained; remaining integument appears unremarkable at this time. There are no open sores, no preulcerative lesions, no rash or signs of infection present.  Vascular: Dorsalis Pedis artery and Posterior Tibial artery pedal pulses are 2/4 bilateral with immedate capillary fill time. Pedal hair growth present. No varicosities and no lower extremity edema present bilateral.   Neruologic: Grossly intact via light touch bilateral. Vibratory intact via tuning fork bilateral. Protective threshold with Semmes Wienstein monofilament intact to all pedal sites bilateral. Patellar and Achilles deep tendon reflexes 2+ bilateral. No Babinski or clonus noted bilateral.   Musculoskeletal: No gross boney pedal  deformities bilateral. No pain, crepitus, or limitation noted with foot and ankle range of motion bilateral. Muscular strength 5/5 in all groups tested bilateral.  Pain and swelling on palpation dorsal aspect of the right foot with no ecchymosis.  Majority the pain is overlying the fourth metatarsal. Gait: Unassisted, Nonantalgic.    Radiographs:  Radiographs taken today demonstrate what appears to be a fracture with some early bone healing and neck of the fourth metatarsal of the right foot on 2 views AP and oblique.  No dislocation no comminution.  Assessment & Plan:   Assessment: Stress fracture fourth metatarsal right foot.    Plan: Placed her and a short cam walker will follow-up with her in 4 weeks for another set of x-rays     Kirsten Jones. [place], [place]

## 2018-11-10 DIAGNOSIS — M9905 Segmental and somatic dysfunction of pelvic region: Secondary | ICD-10-CM | POA: Diagnosis not present

## 2018-11-10 DIAGNOSIS — M62830 Muscle spasm of back: Secondary | ICD-10-CM | POA: Diagnosis not present

## 2018-11-10 DIAGNOSIS — M9903 Segmental and somatic dysfunction of lumbar region: Secondary | ICD-10-CM | POA: Diagnosis not present

## 2018-11-10 DIAGNOSIS — M9902 Segmental and somatic dysfunction of thoracic region: Secondary | ICD-10-CM | POA: Diagnosis not present

## 2018-11-17 DIAGNOSIS — M9905 Segmental and somatic dysfunction of pelvic region: Secondary | ICD-10-CM | POA: Diagnosis not present

## 2018-11-17 DIAGNOSIS — M62830 Muscle spasm of back: Secondary | ICD-10-CM | POA: Diagnosis not present

## 2018-11-17 DIAGNOSIS — M9903 Segmental and somatic dysfunction of lumbar region: Secondary | ICD-10-CM | POA: Diagnosis not present

## 2018-11-17 DIAGNOSIS — M9902 Segmental and somatic dysfunction of thoracic region: Secondary | ICD-10-CM | POA: Diagnosis not present

## 2018-11-18 ENCOUNTER — Telehealth: Payer: Self-pay | Admitting: *Deleted

## 2018-11-18 NOTE — Telephone Encounter (Signed)
I informed pt that she should wear at all times except to sleep or shower. Pt asked if she could see the fracture x-ray at next visit and I told pt I would inform Dr. Milinda Pointer.

## 2018-11-18 NOTE — Telephone Encounter (Signed)
Pt states she was given a boot 1 week ago for a fracture and wanted to know if she should sleep in it.

## 2018-11-21 NOTE — Telephone Encounter (Signed)
That will be fine. 

## 2018-11-23 DIAGNOSIS — M9902 Segmental and somatic dysfunction of thoracic region: Secondary | ICD-10-CM | POA: Diagnosis not present

## 2018-11-23 DIAGNOSIS — M9905 Segmental and somatic dysfunction of pelvic region: Secondary | ICD-10-CM | POA: Diagnosis not present

## 2018-11-23 DIAGNOSIS — M62830 Muscle spasm of back: Secondary | ICD-10-CM | POA: Diagnosis not present

## 2018-11-23 DIAGNOSIS — M9903 Segmental and somatic dysfunction of lumbar region: Secondary | ICD-10-CM | POA: Diagnosis not present

## 2018-11-29 DIAGNOSIS — M9902 Segmental and somatic dysfunction of thoracic region: Secondary | ICD-10-CM | POA: Diagnosis not present

## 2018-11-29 DIAGNOSIS — M9903 Segmental and somatic dysfunction of lumbar region: Secondary | ICD-10-CM | POA: Diagnosis not present

## 2018-11-29 DIAGNOSIS — M62830 Muscle spasm of back: Secondary | ICD-10-CM | POA: Diagnosis not present

## 2018-11-29 DIAGNOSIS — M9905 Segmental and somatic dysfunction of pelvic region: Secondary | ICD-10-CM | POA: Diagnosis not present

## 2018-12-01 ENCOUNTER — Other Ambulatory Visit: Payer: Self-pay

## 2018-12-01 ENCOUNTER — Ambulatory Visit (INDEPENDENT_AMBULATORY_CARE_PROVIDER_SITE_OTHER): Payer: PPO

## 2018-12-01 ENCOUNTER — Encounter: Payer: Self-pay | Admitting: Podiatry

## 2018-12-01 ENCOUNTER — Ambulatory Visit: Payer: PPO | Admitting: Podiatry

## 2018-12-01 DIAGNOSIS — S92344D Nondisplaced fracture of fourth metatarsal bone, right foot, subsequent encounter for fracture with routine healing: Secondary | ICD-10-CM

## 2018-12-01 NOTE — Progress Notes (Signed)
She presents today for follow-up of her fracture fourth metatarsal right foot states that I guess is doing okay.  Objective: Vital signs are stable she is alert oriented x3.  No erythema minimal edema she has pain on palpation to the neck of the fourth metatarsal of the right foot.  Radiographs confirm today a slow healing fracture neck fourth metatarsal right foot.  Assessment: Slow healing fracture fourth met neck right.  Plan: Continue use of the cam walker for another month follow-up with me then.

## 2018-12-12 DIAGNOSIS — M9903 Segmental and somatic dysfunction of lumbar region: Secondary | ICD-10-CM | POA: Diagnosis not present

## 2018-12-12 DIAGNOSIS — M9902 Segmental and somatic dysfunction of thoracic region: Secondary | ICD-10-CM | POA: Diagnosis not present

## 2018-12-12 DIAGNOSIS — M9905 Segmental and somatic dysfunction of pelvic region: Secondary | ICD-10-CM | POA: Diagnosis not present

## 2018-12-12 DIAGNOSIS — M62830 Muscle spasm of back: Secondary | ICD-10-CM | POA: Diagnosis not present

## 2018-12-19 ENCOUNTER — Other Ambulatory Visit: Payer: Self-pay | Admitting: Family Medicine

## 2018-12-19 DIAGNOSIS — M810 Age-related osteoporosis without current pathological fracture: Secondary | ICD-10-CM

## 2018-12-27 DIAGNOSIS — J301 Allergic rhinitis due to pollen: Secondary | ICD-10-CM | POA: Diagnosis not present

## 2018-12-27 DIAGNOSIS — J3089 Other allergic rhinitis: Secondary | ICD-10-CM | POA: Diagnosis not present

## 2019-01-03 ENCOUNTER — Encounter: Payer: Self-pay | Admitting: Podiatry

## 2019-01-03 ENCOUNTER — Ambulatory Visit: Payer: PPO | Admitting: Podiatry

## 2019-01-03 ENCOUNTER — Other Ambulatory Visit: Payer: Self-pay

## 2019-01-03 ENCOUNTER — Ambulatory Visit (INDEPENDENT_AMBULATORY_CARE_PROVIDER_SITE_OTHER): Payer: PPO

## 2019-01-03 DIAGNOSIS — S92344D Nondisplaced fracture of fourth metatarsal bone, right foot, subsequent encounter for fracture with routine healing: Secondary | ICD-10-CM | POA: Diagnosis not present

## 2019-01-04 ENCOUNTER — Encounter: Payer: Self-pay | Admitting: Podiatry

## 2019-01-04 NOTE — Progress Notes (Signed)
She presents today for follow-up of her fourth met fracture she states that is feeling better and she feels like she can do what ever she needs to do.  Objective: Vital signs are stable she is alert and oriented x3.  Pulses are palpable.  Neurologic sensorium is intact.  DP reflexes are intact.  Muscle strength is normal and symmetrical.  She has no edema.  There is no ecchymosis.  She has no tenderness on palpation of the fourth metatarsal neck.  Radiographs taken today demonstrate well-healing fracture of the neck of the fourth metatarsal nondisplaced.  Cutaneous evaluation demonstrates no erythema edema cellulitis drainage or odor.  No ecchymosis.  Assessment: Well-healing neck of the fourth metatarsal right.  Plan: Discussed etiology pathology conservative surgical therapies.  Recommended that she continue to wear stiff soled shoes notify me with any questions or concerns or swelling to the forefoot.

## 2019-01-05 ENCOUNTER — Ambulatory Visit: Payer: PPO | Admitting: Podiatry

## 2019-01-24 DIAGNOSIS — L814 Other melanin hyperpigmentation: Secondary | ICD-10-CM | POA: Diagnosis not present

## 2019-01-24 DIAGNOSIS — D485 Neoplasm of uncertain behavior of skin: Secondary | ICD-10-CM | POA: Diagnosis not present

## 2019-01-24 DIAGNOSIS — L821 Other seborrheic keratosis: Secondary | ICD-10-CM | POA: Diagnosis not present

## 2019-01-24 DIAGNOSIS — B0089 Other herpesviral infection: Secondary | ICD-10-CM | POA: Diagnosis not present

## 2019-01-24 DIAGNOSIS — Z85828 Personal history of other malignant neoplasm of skin: Secondary | ICD-10-CM | POA: Diagnosis not present

## 2019-01-24 DIAGNOSIS — L298 Other pruritus: Secondary | ICD-10-CM | POA: Diagnosis not present

## 2019-01-24 DIAGNOSIS — D229 Melanocytic nevi, unspecified: Secondary | ICD-10-CM | POA: Diagnosis not present

## 2019-02-23 DIAGNOSIS — N951 Menopausal and female climacteric states: Secondary | ICD-10-CM | POA: Diagnosis not present

## 2019-02-23 DIAGNOSIS — E568 Deficiency of other vitamins: Secondary | ICD-10-CM | POA: Diagnosis not present

## 2019-02-23 DIAGNOSIS — R799 Abnormal finding of blood chemistry, unspecified: Secondary | ICD-10-CM | POA: Diagnosis not present

## 2019-02-23 DIAGNOSIS — D518 Other vitamin B12 deficiency anemias: Secondary | ICD-10-CM | POA: Diagnosis not present

## 2019-02-23 DIAGNOSIS — E559 Vitamin D deficiency, unspecified: Secondary | ICD-10-CM | POA: Diagnosis not present

## 2019-02-23 DIAGNOSIS — R5383 Other fatigue: Secondary | ICD-10-CM | POA: Diagnosis not present

## 2019-02-23 DIAGNOSIS — E8881 Metabolic syndrome: Secondary | ICD-10-CM | POA: Diagnosis not present

## 2019-02-23 DIAGNOSIS — E039 Hypothyroidism, unspecified: Secondary | ICD-10-CM | POA: Diagnosis not present

## 2019-03-08 DIAGNOSIS — Z20828 Contact with and (suspected) exposure to other viral communicable diseases: Secondary | ICD-10-CM | POA: Diagnosis not present

## 2019-05-09 DIAGNOSIS — L308 Other specified dermatitis: Secondary | ICD-10-CM | POA: Diagnosis not present

## 2019-05-27 ENCOUNTER — Ambulatory Visit: Payer: PPO | Attending: Internal Medicine

## 2019-05-27 DIAGNOSIS — Z23 Encounter for immunization: Secondary | ICD-10-CM

## 2019-05-27 NOTE — Progress Notes (Signed)
   Covid-19 Vaccination Clinic  Name:  Kirsten Jones    MRN: [id number] DOB: [birth year]  05/27/2019  Kirsten Jones was observed post Covid-19 immunization for 15 minutes without incidence. She was provided with Vaccine Information Sheet and instruction to access the V-Safe system.   Kirsten Jones was instructed to call 911 with any severe reactions post vaccine: Marland Kitchen Difficulty breathing  . Swelling of your face and throat  . A fast heartbeat  . A bad rash all over your body  . Dizziness and weakness    Immunizations Administered    Name Date Dose VIS Date Route   Pfizer COVID-19 Vaccine 05/27/2019  5:45 PM 0.3 mL 03/31/2019 Intramuscular   Manufacturer: [place]   Lot: [id number]   [place]: [id number]

## 2019-05-30 DIAGNOSIS — R002 Palpitations: Secondary | ICD-10-CM | POA: Diagnosis not present

## 2019-05-30 DIAGNOSIS — E7800 Pure hypercholesterolemia, unspecified: Secondary | ICD-10-CM | POA: Diagnosis not present

## 2019-05-30 DIAGNOSIS — E039 Hypothyroidism, unspecified: Secondary | ICD-10-CM | POA: Diagnosis not present

## 2019-05-31 DIAGNOSIS — E039 Hypothyroidism, unspecified: Secondary | ICD-10-CM | POA: Diagnosis not present

## 2019-06-14 ENCOUNTER — Ambulatory Visit: Payer: PPO

## 2019-06-21 ENCOUNTER — Ambulatory Visit: Payer: PPO | Attending: Internal Medicine

## 2019-06-21 DIAGNOSIS — Z23 Encounter for immunization: Secondary | ICD-10-CM | POA: Insufficient documentation

## 2019-06-21 NOTE — Progress Notes (Signed)
   Covid-19 Vaccination Clinic  Name:  Kirsten Jones    MRN: [id number] DOB: 1950-06-16  06/21/2019  [name] was observed post Covid-19 immunization for 15 minutes without incident. She was provided with Vaccine Information Sheet and instruction to access the V-Safe system.   [name] was instructed to call 911 with any severe reactions post vaccine: Marland Kitchen Difficulty breathing  . Swelling of face and throat  . A fast heartbeat  . A bad rash all over body  . Dizziness and weakness   Immunizations Administered    Name Date Dose VIS Date Route   Pfizer COVID-19 Vaccine 06/21/2019  3:16 PM 0.3 mL 03/31/2019 Intramuscular   Manufacturer: [place]   Lot: [id number]   [place]: [id number]

## 2019-08-23 DIAGNOSIS — N951 Menopausal and female climacteric states: Secondary | ICD-10-CM | POA: Diagnosis not present

## 2019-08-23 DIAGNOSIS — Z780 Asymptomatic menopausal state: Secondary | ICD-10-CM | POA: Diagnosis not present

## 2019-08-23 DIAGNOSIS — R799 Abnormal finding of blood chemistry, unspecified: Secondary | ICD-10-CM | POA: Diagnosis not present

## 2019-08-23 DIAGNOSIS — E559 Vitamin D deficiency, unspecified: Secondary | ICD-10-CM | POA: Diagnosis not present

## 2019-08-23 DIAGNOSIS — E039 Hypothyroidism, unspecified: Secondary | ICD-10-CM | POA: Diagnosis not present

## 2019-08-23 DIAGNOSIS — R5382 Chronic fatigue, unspecified: Secondary | ICD-10-CM | POA: Diagnosis not present

## 2019-09-26 DIAGNOSIS — Z20822 Contact with and (suspected) exposure to covid-19: Secondary | ICD-10-CM | POA: Diagnosis not present

## 2019-10-02 DIAGNOSIS — M62830 Muscle spasm of back: Secondary | ICD-10-CM | POA: Diagnosis not present

## 2019-10-02 DIAGNOSIS — M9901 Segmental and somatic dysfunction of cervical region: Secondary | ICD-10-CM | POA: Diagnosis not present

## 2019-10-02 DIAGNOSIS — M9903 Segmental and somatic dysfunction of lumbar region: Secondary | ICD-10-CM | POA: Diagnosis not present

## 2019-10-02 DIAGNOSIS — M545 Low back pain: Secondary | ICD-10-CM | POA: Diagnosis not present

## 2019-10-04 DIAGNOSIS — M62830 Muscle spasm of back: Secondary | ICD-10-CM | POA: Diagnosis not present

## 2019-10-04 DIAGNOSIS — M9901 Segmental and somatic dysfunction of cervical region: Secondary | ICD-10-CM | POA: Diagnosis not present

## 2019-10-04 DIAGNOSIS — M545 Low back pain: Secondary | ICD-10-CM | POA: Diagnosis not present

## 2019-10-04 DIAGNOSIS — M9903 Segmental and somatic dysfunction of lumbar region: Secondary | ICD-10-CM | POA: Diagnosis not present

## 2019-10-09 DIAGNOSIS — M62830 Muscle spasm of back: Secondary | ICD-10-CM | POA: Diagnosis not present

## 2019-10-09 DIAGNOSIS — M9901 Segmental and somatic dysfunction of cervical region: Secondary | ICD-10-CM | POA: Diagnosis not present

## 2019-10-09 DIAGNOSIS — M545 Low back pain: Secondary | ICD-10-CM | POA: Diagnosis not present

## 2019-10-09 DIAGNOSIS — M9903 Segmental and somatic dysfunction of lumbar region: Secondary | ICD-10-CM | POA: Diagnosis not present

## 2019-10-16 DIAGNOSIS — M545 Low back pain: Secondary | ICD-10-CM | POA: Diagnosis not present

## 2019-10-16 DIAGNOSIS — M62830 Muscle spasm of back: Secondary | ICD-10-CM | POA: Diagnosis not present

## 2019-10-16 DIAGNOSIS — M9903 Segmental and somatic dysfunction of lumbar region: Secondary | ICD-10-CM | POA: Diagnosis not present

## 2019-10-16 DIAGNOSIS — M9901 Segmental and somatic dysfunction of cervical region: Secondary | ICD-10-CM | POA: Diagnosis not present

## 2019-10-19 DIAGNOSIS — M62830 Muscle spasm of back: Secondary | ICD-10-CM | POA: Diagnosis not present

## 2019-10-19 DIAGNOSIS — M9903 Segmental and somatic dysfunction of lumbar region: Secondary | ICD-10-CM | POA: Diagnosis not present

## 2019-10-19 DIAGNOSIS — M9901 Segmental and somatic dysfunction of cervical region: Secondary | ICD-10-CM | POA: Diagnosis not present

## 2019-10-19 DIAGNOSIS — M545 Low back pain: Secondary | ICD-10-CM | POA: Diagnosis not present

## 2019-10-23 DIAGNOSIS — M9901 Segmental and somatic dysfunction of cervical region: Secondary | ICD-10-CM | POA: Diagnosis not present

## 2019-10-23 DIAGNOSIS — M545 Low back pain: Secondary | ICD-10-CM | POA: Diagnosis not present

## 2019-10-23 DIAGNOSIS — M9903 Segmental and somatic dysfunction of lumbar region: Secondary | ICD-10-CM | POA: Diagnosis not present

## 2019-10-23 DIAGNOSIS — M62830 Muscle spasm of back: Secondary | ICD-10-CM | POA: Diagnosis not present

## 2019-11-20 DIAGNOSIS — D485 Neoplasm of uncertain behavior of skin: Secondary | ICD-10-CM | POA: Diagnosis not present

## 2019-11-20 DIAGNOSIS — Z85828 Personal history of other malignant neoplasm of skin: Secondary | ICD-10-CM | POA: Diagnosis not present

## 2019-11-20 DIAGNOSIS — L905 Scar conditions and fibrosis of skin: Secondary | ICD-10-CM | POA: Diagnosis not present

## 2019-11-20 DIAGNOSIS — C44529 Squamous cell carcinoma of skin of other part of trunk: Secondary | ICD-10-CM | POA: Diagnosis not present

## 2019-11-28 DIAGNOSIS — M79641 Pain in right hand: Secondary | ICD-10-CM | POA: Diagnosis not present

## 2019-11-28 DIAGNOSIS — S52591A Other fractures of lower end of right radius, initial encounter for closed fracture: Secondary | ICD-10-CM | POA: Diagnosis not present

## 2019-12-19 DIAGNOSIS — S52591A Other fractures of lower end of right radius, initial encounter for closed fracture: Secondary | ICD-10-CM | POA: Diagnosis not present

## 2020-01-08 DIAGNOSIS — J3089 Other allergic rhinitis: Secondary | ICD-10-CM | POA: Diagnosis not present

## 2020-01-08 DIAGNOSIS — J301 Allergic rhinitis due to pollen: Secondary | ICD-10-CM | POA: Diagnosis not present

## 2020-01-19 DIAGNOSIS — M654 Radial styloid tenosynovitis [de Quervain]: Secondary | ICD-10-CM | POA: Diagnosis not present

## 2020-01-19 DIAGNOSIS — S52591A Other fractures of lower end of right radius, initial encounter for closed fracture: Secondary | ICD-10-CM | POA: Diagnosis not present

## 2020-02-01 DIAGNOSIS — R799 Abnormal finding of blood chemistry, unspecified: Secondary | ICD-10-CM | POA: Diagnosis not present

## 2020-02-01 DIAGNOSIS — N951 Menopausal and female climacteric states: Secondary | ICD-10-CM | POA: Diagnosis not present

## 2020-02-01 DIAGNOSIS — R5382 Chronic fatigue, unspecified: Secondary | ICD-10-CM | POA: Diagnosis not present

## 2020-02-01 DIAGNOSIS — D582 Other hemoglobinopathies: Secondary | ICD-10-CM | POA: Diagnosis not present

## 2020-02-01 DIAGNOSIS — T560X1A Toxic effect of lead and its compounds, accidental (unintentional), initial encounter: Secondary | ICD-10-CM | POA: Diagnosis not present

## 2020-02-01 DIAGNOSIS — E559 Vitamin D deficiency, unspecified: Secondary | ICD-10-CM | POA: Diagnosis not present

## 2020-02-01 DIAGNOSIS — E785 Hyperlipidemia, unspecified: Secondary | ICD-10-CM | POA: Diagnosis not present

## 2020-02-01 DIAGNOSIS — E7212 Methylenetetrahydrofolate reductase deficiency: Secondary | ICD-10-CM | POA: Diagnosis not present

## 2020-02-01 DIAGNOSIS — Z780 Asymptomatic menopausal state: Secondary | ICD-10-CM | POA: Diagnosis not present

## 2020-03-04 DIAGNOSIS — H43811 Vitreous degeneration, right eye: Secondary | ICD-10-CM | POA: Diagnosis not present

## 2020-03-21 DIAGNOSIS — Z20828 Contact with and (suspected) exposure to other viral communicable diseases: Secondary | ICD-10-CM | POA: Diagnosis not present

## 2020-03-30 DIAGNOSIS — J019 Acute sinusitis, unspecified: Secondary | ICD-10-CM | POA: Diagnosis not present

## 2020-03-30 DIAGNOSIS — J22 Unspecified acute lower respiratory infection: Secondary | ICD-10-CM | POA: Diagnosis not present

## 2020-05-20 DIAGNOSIS — H6980 Other specified disorders of Eustachian tube, unspecified ear: Secondary | ICD-10-CM | POA: Diagnosis not present

## 2020-05-20 DIAGNOSIS — H6091 Unspecified otitis externa, right ear: Secondary | ICD-10-CM | POA: Diagnosis not present

## 2020-05-22 DIAGNOSIS — L814 Other melanin hyperpigmentation: Secondary | ICD-10-CM | POA: Diagnosis not present

## 2020-05-22 DIAGNOSIS — I8393 Asymptomatic varicose veins of bilateral lower extremities: Secondary | ICD-10-CM | POA: Diagnosis not present

## 2020-05-22 DIAGNOSIS — Z85828 Personal history of other malignant neoplasm of skin: Secondary | ICD-10-CM | POA: Diagnosis not present

## 2020-05-22 DIAGNOSIS — L905 Scar conditions and fibrosis of skin: Secondary | ICD-10-CM | POA: Diagnosis not present

## 2020-05-22 DIAGNOSIS — D485 Neoplasm of uncertain behavior of skin: Secondary | ICD-10-CM | POA: Diagnosis not present

## 2020-05-22 DIAGNOSIS — L820 Inflamed seborrheic keratosis: Secondary | ICD-10-CM | POA: Diagnosis not present

## 2020-05-22 DIAGNOSIS — L821 Other seborrheic keratosis: Secondary | ICD-10-CM | POA: Diagnosis not present

## 2020-05-22 DIAGNOSIS — D229 Melanocytic nevi, unspecified: Secondary | ICD-10-CM | POA: Diagnosis not present

## 2020-05-23 DIAGNOSIS — N959 Unspecified menopausal and perimenopausal disorder: Secondary | ICD-10-CM | POA: Diagnosis not present

## 2020-05-27 ENCOUNTER — Other Ambulatory Visit: Payer: Self-pay | Admitting: Family Medicine

## 2020-05-27 DIAGNOSIS — E2839 Other primary ovarian failure: Secondary | ICD-10-CM

## 2020-05-29 DIAGNOSIS — E7212 Methylenetetrahydrofolate reductase deficiency: Secondary | ICD-10-CM | POA: Diagnosis not present

## 2020-05-29 DIAGNOSIS — N951 Menopausal and female climacteric states: Secondary | ICD-10-CM | POA: Diagnosis not present

## 2020-05-29 DIAGNOSIS — Z780 Asymptomatic menopausal state: Secondary | ICD-10-CM | POA: Diagnosis not present

## 2020-05-29 DIAGNOSIS — E559 Vitamin D deficiency, unspecified: Secondary | ICD-10-CM | POA: Diagnosis not present

## 2020-05-29 DIAGNOSIS — T560X1A Toxic effect of lead and its compounds, accidental (unintentional), initial encounter: Secondary | ICD-10-CM | POA: Diagnosis not present

## 2020-05-29 DIAGNOSIS — R5382 Chronic fatigue, unspecified: Secondary | ICD-10-CM | POA: Diagnosis not present

## 2020-05-29 DIAGNOSIS — D582 Other hemoglobinopathies: Secondary | ICD-10-CM | POA: Diagnosis not present

## 2020-05-29 DIAGNOSIS — R799 Abnormal finding of blood chemistry, unspecified: Secondary | ICD-10-CM | POA: Diagnosis not present

## 2020-05-29 DIAGNOSIS — E785 Hyperlipidemia, unspecified: Secondary | ICD-10-CM | POA: Diagnosis not present

## 2020-06-04 DIAGNOSIS — M654 Radial styloid tenosynovitis [de Quervain]: Secondary | ICD-10-CM | POA: Diagnosis not present

## 2020-06-05 ENCOUNTER — Other Ambulatory Visit: Payer: Self-pay | Admitting: Family Medicine

## 2020-06-05 DIAGNOSIS — E2839 Other primary ovarian failure: Secondary | ICD-10-CM

## 2020-06-10 DIAGNOSIS — M654 Radial styloid tenosynovitis [de Quervain]: Secondary | ICD-10-CM | POA: Diagnosis not present

## 2020-06-15 ENCOUNTER — Ambulatory Visit
Admission: RE | Admit: 2020-06-15 | Discharge: 2020-06-15 | Disposition: A | Discharge status: Home / Self Care | Payer: PPO | Source: Ambulatory Visit | Attending: Family Medicine | Admitting: Family Medicine

## 2020-06-15 ENCOUNTER — Other Ambulatory Visit: Payer: Self-pay

## 2020-06-15 DIAGNOSIS — M85852 Other specified disorders of bone density and structure, left thigh: Secondary | ICD-10-CM | POA: Diagnosis not present

## 2020-06-15 DIAGNOSIS — Z79890 Hormone replacement therapy (postmenopausal): Secondary | ICD-10-CM | POA: Diagnosis not present

## 2020-06-15 DIAGNOSIS — E2839 Other primary ovarian failure: Secondary | ICD-10-CM

## 2020-07-02 DIAGNOSIS — L988 Other specified disorders of the skin and subcutaneous tissue: Secondary | ICD-10-CM | POA: Diagnosis not present

## 2020-07-02 DIAGNOSIS — H57813 Brow ptosis, bilateral: Secondary | ICD-10-CM | POA: Diagnosis not present

## 2020-07-02 DIAGNOSIS — H02413 Mechanical ptosis of bilateral eyelids: Secondary | ICD-10-CM | POA: Diagnosis not present

## 2020-07-02 DIAGNOSIS — H02831 Dermatochalasis of right upper eyelid: Secondary | ICD-10-CM | POA: Diagnosis not present

## 2020-07-02 DIAGNOSIS — H02834 Dermatochalasis of left upper eyelid: Secondary | ICD-10-CM | POA: Diagnosis not present

## 2020-07-09 DIAGNOSIS — H53483 Generalized contraction of visual field, bilateral: Secondary | ICD-10-CM | POA: Diagnosis not present

## 2020-07-09 DIAGNOSIS — H53481 Generalized contraction of visual field, right eye: Secondary | ICD-10-CM | POA: Diagnosis not present

## 2020-07-09 DIAGNOSIS — H53482 Generalized contraction of visual field, left eye: Secondary | ICD-10-CM | POA: Diagnosis not present

## 2020-07-23 DIAGNOSIS — S52591A Other fractures of lower end of right radius, initial encounter for closed fracture: Secondary | ICD-10-CM | POA: Diagnosis not present

## 2020-07-23 DIAGNOSIS — M654 Radial styloid tenosynovitis [de Quervain]: Secondary | ICD-10-CM | POA: Diagnosis not present

## 2020-09-10 DIAGNOSIS — M5441 Lumbago with sciatica, right side: Secondary | ICD-10-CM | POA: Diagnosis not present

## 2020-09-10 DIAGNOSIS — G8929 Other chronic pain: Secondary | ICD-10-CM | POA: Diagnosis not present

## 2020-09-11 DIAGNOSIS — Z8739 Personal history of other diseases of the musculoskeletal system and connective tissue: Secondary | ICD-10-CM | POA: Diagnosis not present

## 2020-09-11 DIAGNOSIS — E785 Hyperlipidemia, unspecified: Secondary | ICD-10-CM | POA: Diagnosis not present

## 2020-09-11 DIAGNOSIS — B009 Herpesviral infection, unspecified: Secondary | ICD-10-CM | POA: Diagnosis not present

## 2020-09-11 DIAGNOSIS — E8881 Metabolic syndrome: Secondary | ICD-10-CM | POA: Diagnosis not present

## 2020-09-11 DIAGNOSIS — R5382 Chronic fatigue, unspecified: Secondary | ICD-10-CM | POA: Diagnosis not present

## 2020-09-11 DIAGNOSIS — N951 Menopausal and female climacteric states: Secondary | ICD-10-CM | POA: Diagnosis not present

## 2020-09-11 DIAGNOSIS — M25551 Pain in right hip: Secondary | ICD-10-CM | POA: Diagnosis not present

## 2020-09-11 DIAGNOSIS — R799 Abnormal finding of blood chemistry, unspecified: Secondary | ICD-10-CM | POA: Diagnosis not present

## 2020-09-11 DIAGNOSIS — E039 Hypothyroidism, unspecified: Secondary | ICD-10-CM | POA: Diagnosis not present

## 2020-09-11 DIAGNOSIS — E559 Vitamin D deficiency, unspecified: Secondary | ICD-10-CM | POA: Diagnosis not present

## 2020-09-11 DIAGNOSIS — L659 Nonscarring hair loss, unspecified: Secondary | ICD-10-CM | POA: Diagnosis not present

## 2020-09-17 DIAGNOSIS — M25551 Pain in right hip: Secondary | ICD-10-CM | POA: Diagnosis not present

## 2020-09-20 DIAGNOSIS — M25551 Pain in right hip: Secondary | ICD-10-CM | POA: Diagnosis not present

## 2020-09-23 DIAGNOSIS — M25551 Pain in right hip: Secondary | ICD-10-CM | POA: Diagnosis not present

## 2020-09-25 DIAGNOSIS — M25551 Pain in right hip: Secondary | ICD-10-CM | POA: Diagnosis not present

## 2020-09-30 DIAGNOSIS — M25551 Pain in right hip: Secondary | ICD-10-CM | POA: Diagnosis not present

## 2020-10-07 DIAGNOSIS — M545 Low back pain, unspecified: Secondary | ICD-10-CM | POA: Diagnosis not present

## 2020-10-09 DIAGNOSIS — M545 Low back pain, unspecified: Secondary | ICD-10-CM | POA: Diagnosis not present

## 2020-10-15 DIAGNOSIS — M545 Low back pain, unspecified: Secondary | ICD-10-CM | POA: Diagnosis not present

## 2020-10-17 DIAGNOSIS — M545 Low back pain, unspecified: Secondary | ICD-10-CM | POA: Diagnosis not present

## 2020-10-22 DIAGNOSIS — M545 Low back pain, unspecified: Secondary | ICD-10-CM | POA: Diagnosis not present

## 2020-10-23 ENCOUNTER — Other Ambulatory Visit: Payer: PPO

## 2020-10-27 DIAGNOSIS — M545 Low back pain, unspecified: Secondary | ICD-10-CM | POA: Diagnosis not present

## 2020-11-06 DIAGNOSIS — M545 Low back pain, unspecified: Secondary | ICD-10-CM | POA: Diagnosis not present

## 2020-11-19 DIAGNOSIS — D1801 Hemangioma of skin and subcutaneous tissue: Secondary | ICD-10-CM | POA: Diagnosis not present

## 2020-11-19 DIAGNOSIS — D229 Melanocytic nevi, unspecified: Secondary | ICD-10-CM | POA: Diagnosis not present

## 2020-11-19 DIAGNOSIS — L814 Other melanin hyperpigmentation: Secondary | ICD-10-CM | POA: Diagnosis not present

## 2020-11-19 DIAGNOSIS — I8393 Asymptomatic varicose veins of bilateral lower extremities: Secondary | ICD-10-CM | POA: Diagnosis not present

## 2020-11-19 DIAGNOSIS — Z85828 Personal history of other malignant neoplasm of skin: Secondary | ICD-10-CM | POA: Diagnosis not present

## 2020-11-19 DIAGNOSIS — L905 Scar conditions and fibrosis of skin: Secondary | ICD-10-CM | POA: Diagnosis not present

## 2020-11-19 DIAGNOSIS — L819 Disorder of pigmentation, unspecified: Secondary | ICD-10-CM | POA: Diagnosis not present

## 2020-11-19 DIAGNOSIS — L812 Freckles: Secondary | ICD-10-CM | POA: Diagnosis not present

## 2020-11-19 DIAGNOSIS — L821 Other seborrheic keratosis: Secondary | ICD-10-CM | POA: Diagnosis not present

## 2020-11-19 DIAGNOSIS — M545 Low back pain, unspecified: Secondary | ICD-10-CM | POA: Diagnosis not present

## 2020-11-19 DIAGNOSIS — I83813 Varicose veins of bilateral lower extremities with pain: Secondary | ICD-10-CM | POA: Diagnosis not present

## 2020-11-26 DIAGNOSIS — M545 Low back pain, unspecified: Secondary | ICD-10-CM | POA: Diagnosis not present

## 2020-12-03 DIAGNOSIS — M545 Low back pain, unspecified: Secondary | ICD-10-CM | POA: Diagnosis not present

## 2020-12-10 DIAGNOSIS — E8881 Metabolic syndrome: Secondary | ICD-10-CM | POA: Diagnosis not present

## 2020-12-10 DIAGNOSIS — N951 Menopausal and female climacteric states: Secondary | ICD-10-CM | POA: Diagnosis not present

## 2020-12-10 DIAGNOSIS — B009 Herpesviral infection, unspecified: Secondary | ICD-10-CM | POA: Diagnosis not present

## 2020-12-10 DIAGNOSIS — Z8739 Personal history of other diseases of the musculoskeletal system and connective tissue: Secondary | ICD-10-CM | POA: Diagnosis not present

## 2020-12-10 DIAGNOSIS — R5382 Chronic fatigue, unspecified: Secondary | ICD-10-CM | POA: Diagnosis not present

## 2020-12-10 DIAGNOSIS — L659 Nonscarring hair loss, unspecified: Secondary | ICD-10-CM | POA: Diagnosis not present

## 2020-12-10 DIAGNOSIS — E039 Hypothyroidism, unspecified: Secondary | ICD-10-CM | POA: Diagnosis not present

## 2020-12-10 DIAGNOSIS — M545 Low back pain, unspecified: Secondary | ICD-10-CM | POA: Diagnosis not present

## 2020-12-10 DIAGNOSIS — E559 Vitamin D deficiency, unspecified: Secondary | ICD-10-CM | POA: Diagnosis not present

## 2020-12-10 DIAGNOSIS — R799 Abnormal finding of blood chemistry, unspecified: Secondary | ICD-10-CM | POA: Diagnosis not present

## 2020-12-12 DIAGNOSIS — H02834 Dermatochalasis of left upper eyelid: Secondary | ICD-10-CM | POA: Diagnosis not present

## 2020-12-12 DIAGNOSIS — H57813 Brow ptosis, bilateral: Secondary | ICD-10-CM | POA: Diagnosis not present

## 2020-12-12 DIAGNOSIS — H02413 Mechanical ptosis of bilateral eyelids: Secondary | ICD-10-CM | POA: Diagnosis not present

## 2020-12-12 DIAGNOSIS — H02831 Dermatochalasis of right upper eyelid: Secondary | ICD-10-CM | POA: Diagnosis not present

## 2021-01-13 DIAGNOSIS — J0140 Acute pansinusitis, unspecified: Secondary | ICD-10-CM | POA: Diagnosis not present

## 2021-01-30 DIAGNOSIS — J0111 Acute recurrent frontal sinusitis: Secondary | ICD-10-CM | POA: Diagnosis not present

## 2021-03-04 DIAGNOSIS — J3089 Other allergic rhinitis: Secondary | ICD-10-CM | POA: Diagnosis not present

## 2021-03-04 DIAGNOSIS — J301 Allergic rhinitis due to pollen: Secondary | ICD-10-CM | POA: Diagnosis not present

## 2021-03-20 DIAGNOSIS — M5451 Vertebrogenic low back pain: Secondary | ICD-10-CM | POA: Diagnosis not present

## 2021-03-26 DIAGNOSIS — H43813 Vitreous degeneration, bilateral: Secondary | ICD-10-CM | POA: Diagnosis not present

## 2021-04-03 DIAGNOSIS — M654 Radial styloid tenosynovitis [de Quervain]: Secondary | ICD-10-CM | POA: Diagnosis not present

## 2021-04-03 DIAGNOSIS — M25532 Pain in left wrist: Secondary | ICD-10-CM | POA: Diagnosis not present

## 2021-05-01 DIAGNOSIS — R5382 Chronic fatigue, unspecified: Secondary | ICD-10-CM | POA: Diagnosis not present

## 2021-05-01 DIAGNOSIS — E785 Hyperlipidemia, unspecified: Secondary | ICD-10-CM | POA: Diagnosis not present

## 2021-05-01 DIAGNOSIS — E8881 Metabolic syndrome: Secondary | ICD-10-CM | POA: Diagnosis not present

## 2021-05-01 DIAGNOSIS — R799 Abnormal finding of blood chemistry, unspecified: Secondary | ICD-10-CM | POA: Diagnosis not present

## 2021-05-01 DIAGNOSIS — E559 Vitamin D deficiency, unspecified: Secondary | ICD-10-CM | POA: Diagnosis not present

## 2021-05-01 DIAGNOSIS — N951 Menopausal and female climacteric states: Secondary | ICD-10-CM | POA: Diagnosis not present

## 2021-05-01 DIAGNOSIS — Z780 Asymptomatic menopausal state: Secondary | ICD-10-CM | POA: Diagnosis not present

## 2021-05-01 DIAGNOSIS — E039 Hypothyroidism, unspecified: Secondary | ICD-10-CM | POA: Diagnosis not present

## 2021-05-12 DIAGNOSIS — J321 Chronic frontal sinusitis: Secondary | ICD-10-CM | POA: Diagnosis not present

## 2021-05-30 DIAGNOSIS — H6091 Unspecified otitis externa, right ear: Secondary | ICD-10-CM | POA: Diagnosis not present

## 2021-08-05 DIAGNOSIS — H8110 Benign paroxysmal vertigo, unspecified ear: Secondary | ICD-10-CM | POA: Diagnosis not present

## 2021-08-06 DIAGNOSIS — H8110 Benign paroxysmal vertigo, unspecified ear: Secondary | ICD-10-CM | POA: Diagnosis not present

## 2021-08-06 DIAGNOSIS — R2681 Unsteadiness on feet: Secondary | ICD-10-CM | POA: Diagnosis not present

## 2021-08-08 DIAGNOSIS — Z780 Asymptomatic menopausal state: Secondary | ICD-10-CM | POA: Diagnosis not present

## 2021-08-08 DIAGNOSIS — R5382 Chronic fatigue, unspecified: Secondary | ICD-10-CM | POA: Diagnosis not present

## 2021-08-08 DIAGNOSIS — N951 Menopausal and female climacteric states: Secondary | ICD-10-CM | POA: Diagnosis not present

## 2021-08-08 DIAGNOSIS — E8881 Metabolic syndrome: Secondary | ICD-10-CM | POA: Diagnosis not present

## 2021-08-08 DIAGNOSIS — R799 Abnormal finding of blood chemistry, unspecified: Secondary | ICD-10-CM | POA: Diagnosis not present

## 2021-08-08 DIAGNOSIS — E559 Vitamin D deficiency, unspecified: Secondary | ICD-10-CM | POA: Diagnosis not present

## 2021-08-08 DIAGNOSIS — E039 Hypothyroidism, unspecified: Secondary | ICD-10-CM | POA: Diagnosis not present

## 2021-08-08 DIAGNOSIS — E785 Hyperlipidemia, unspecified: Secondary | ICD-10-CM | POA: Diagnosis not present

## 2021-09-10 DIAGNOSIS — C44319 Basal cell carcinoma of skin of other parts of face: Secondary | ICD-10-CM | POA: Diagnosis not present

## 2021-09-10 DIAGNOSIS — D492 Neoplasm of unspecified behavior of bone, soft tissue, and skin: Secondary | ICD-10-CM | POA: Diagnosis not present

## 2021-10-28 DIAGNOSIS — M654 Radial styloid tenosynovitis [de Quervain]: Secondary | ICD-10-CM | POA: Diagnosis not present

## 2021-11-16 DIAGNOSIS — J40 Bronchitis, not specified as acute or chronic: Secondary | ICD-10-CM | POA: Diagnosis not present

## 2021-11-16 DIAGNOSIS — J329 Chronic sinusitis, unspecified: Secondary | ICD-10-CM | POA: Diagnosis not present

## 2021-11-16 DIAGNOSIS — H66001 Acute suppurative otitis media without spontaneous rupture of ear drum, right ear: Secondary | ICD-10-CM | POA: Diagnosis not present

## 2021-11-16 DIAGNOSIS — R059 Cough, unspecified: Secondary | ICD-10-CM | POA: Diagnosis not present

## 2021-11-19 DIAGNOSIS — C44319 Basal cell carcinoma of skin of other parts of face: Secondary | ICD-10-CM | POA: Diagnosis not present

## 2021-11-26 DIAGNOSIS — J329 Chronic sinusitis, unspecified: Secondary | ICD-10-CM | POA: Diagnosis not present

## 2021-11-26 DIAGNOSIS — Z9109 Other allergy status, other than to drugs and biological substances: Secondary | ICD-10-CM | POA: Diagnosis not present

## 2021-11-26 DIAGNOSIS — H60501 Unspecified acute noninfective otitis externa, right ear: Secondary | ICD-10-CM | POA: Diagnosis not present

## 2021-11-26 DIAGNOSIS — J40 Bronchitis, not specified as acute or chronic: Secondary | ICD-10-CM | POA: Diagnosis not present

## 2021-11-26 DIAGNOSIS — R059 Cough, unspecified: Secondary | ICD-10-CM | POA: Diagnosis not present

## 2022-01-09 DIAGNOSIS — N951 Menopausal and female climacteric states: Secondary | ICD-10-CM | POA: Diagnosis not present

## 2022-01-09 DIAGNOSIS — E039 Hypothyroidism, unspecified: Secondary | ICD-10-CM | POA: Diagnosis not present

## 2022-01-09 DIAGNOSIS — Z780 Asymptomatic menopausal state: Secondary | ICD-10-CM | POA: Diagnosis not present

## 2022-01-09 DIAGNOSIS — R799 Abnormal finding of blood chemistry, unspecified: Secondary | ICD-10-CM | POA: Diagnosis not present

## 2022-01-09 DIAGNOSIS — E8881 Metabolic syndrome: Secondary | ICD-10-CM | POA: Diagnosis not present

## 2022-01-09 DIAGNOSIS — E559 Vitamin D deficiency, unspecified: Secondary | ICD-10-CM | POA: Diagnosis not present

## 2022-01-09 DIAGNOSIS — R5382 Chronic fatigue, unspecified: Secondary | ICD-10-CM | POA: Diagnosis not present

## 2022-01-09 DIAGNOSIS — E785 Hyperlipidemia, unspecified: Secondary | ICD-10-CM | POA: Diagnosis not present

## 2022-01-13 DIAGNOSIS — J40 Bronchitis, not specified as acute or chronic: Secondary | ICD-10-CM | POA: Diagnosis not present

## 2022-02-10 DIAGNOSIS — C44329 Squamous cell carcinoma of skin of other parts of face: Secondary | ICD-10-CM | POA: Diagnosis not present

## 2022-02-10 DIAGNOSIS — L821 Other seborrheic keratosis: Secondary | ICD-10-CM | POA: Diagnosis not present

## 2022-02-10 DIAGNOSIS — L814 Other melanin hyperpigmentation: Secondary | ICD-10-CM | POA: Diagnosis not present

## 2022-02-10 DIAGNOSIS — D492 Neoplasm of unspecified behavior of bone, soft tissue, and skin: Secondary | ICD-10-CM | POA: Diagnosis not present

## 2022-02-10 DIAGNOSIS — D225 Melanocytic nevi of trunk: Secondary | ICD-10-CM | POA: Diagnosis not present

## 2022-02-11 ENCOUNTER — Ambulatory Visit (INDEPENDENT_AMBULATORY_CARE_PROVIDER_SITE_OTHER): Payer: PPO | Admitting: Allergy

## 2022-02-11 ENCOUNTER — Encounter: Payer: Self-pay | Admitting: Allergy

## 2022-02-11 VITALS — BP 120/60 | HR 87 | Temp 98.4°F | Resp 16 | Ht 65.5 in | Wt 169.6 lb

## 2022-02-11 DIAGNOSIS — J3089 Other allergic rhinitis: Secondary | ICD-10-CM | POA: Diagnosis not present

## 2022-02-11 MED ORDER — LEVOCETIRIZINE DIHYDROCHLORIDE 5 MG PO TABS: 5.0000 mg | ORAL_TABLET | Freq: Every day | ORAL | 3 refills | Status: AC

## 2022-02-11 NOTE — Patient Instructions (Addendum)
Allergic rhinitis - Continue avoidance measures for pollens and feathers - Continue with: Xyzal (levocetirizine) '5mg'$  tablet once daily. Azelastine (also called Astepro over-the-counter) 2 sprays per nostril 1-2 times daily as needed for post nasal drip. Can use Flonase if you have nasal congestion and can use 2 sprays each nostril daily for 1-2 weeks at a time before stopping once nasal congestion improves for maximum benefit.  - If allergy symptoms change or medications become less effective then consider updating your allergy testing  Requesting records from [facility] in 1 year or sooner if needed

## 2022-02-11 NOTE — Progress Notes (Signed)
New Patient Note  RE: Kirsten Jones MRN: [id number] DOB: [birth year] Date of Office Visit: 02/11/2022  Primary care provider: Marda Stalker, PA-C  Chief Complaint: allergies  History of present illness: Kirsten Jones is a 71 y.o. female presenting today for evaluation of allergic rhinitis.   She is a former patient of [facility] for allergic rhinitis.  She reports having testing years ago that she recalls being positive to pollens and feathers. She has been taking xyzal and she can tell it must be working as has been off for past 4 days for visit today and her nose has been running.  She has used flonase and azelastine in the past but has not used in a long time.  She does have the available to use if needed. She does report history of post nasal drip primarily as her allergy symptom.  Symptoms can occur any time of the year.  She has never under gone allergen immunotherapy.   She has no history of asthma, eczema or food allergy.   Review of systems: Review of Systems  Constitutional: Negative.   HENT:  Positive for rhinorrhea.   Eyes: Negative.   Respiratory: Negative.    Cardiovascular: Negative.   Gastrointestinal: Negative.   Musculoskeletal: Negative.   Skin: Negative.   Allergic/Immunologic: Negative.   Neurological: Negative.     All other systems negative unless noted above in HPI  Past medical history: Past Medical History:  Diagnosis Date   Allergy to environmental factors    Basal cell carcinoma (BCC) of face    Hypercholesterolemia    Hypothyroidism    Tenosynovitis    Vertigo, benign paroxysmal     Past surgical history: Past Surgical History:  Procedure Laterality Date   Gallbladder laparoscopy     TONSILLECTOMY      Family history:  Family History  Problem Relation Age of Onset   Breast cancer Paternal Grandmother    Allergic rhinitis Daughter     Social history: Lives in a home without carpeting with gas heating and central cooling.   No pets in the home.  No concern for water damage, mildew or roaches in the home.  She is an Biochemist, clinical.  Denies smoking history.     Medication List: Current Outpatient Medications  Medication Sig Dispense Refill   Ascorbic Acid (VITAMIN C PO) Take by mouth daily. Liposomal     atorvastatin (LIPITOR) 20 MG tablet Take 1 tablet by mouth daily.     B Complex Vitamins (B COMPLEX PO) Take by mouth daily.     Cholecalciferol (VITAMIN D3 PO) Take by mouth daily. With K2     Estrad-Estriol-Testost-Progest (BI-EST PROGEST-TESTOSTERONE) CREA Place 5 mg onto the skin daily.     finasteride (PROSCAR) 5 MG tablet finasteride 5 mg tablet     levocetirizine (XYZAL) 5 MG tablet TAKE 1 TABLET BY MOUTH ONCE DAILY IN THE EVENING FOR 90 DAYS     NALTREXONE HCL PO Take 4.5 mg by mouth daily.     NON FORMULARY 100 mg. 7 Keto DHEA     NP THYROID 120 MG tablet Take 120 mg by mouth every morning.     Omega-3 Fatty Acids (FISH OIL PO) Take by mouth daily. EPA-DHA 1000     PRESCRIPTION MEDICATION "Hormones"     progesterone (PROMETRIUM) 100 MG capsule Take 100 mg by mouth at bedtime.     Zinc 30 MG CAPS Take by mouth daily.     ARMOUR THYROID  60 MG tablet  (Patient not taking: Reported on 02/11/2022)     Azelastine HCl 0.15 % SOLN USE 1 SPRAY(S) IN EACH NOSTRIL TWICE DAILY (Patient not taking: Reported on 02/11/2022)     valACYclovir (VALTREX) 500 MG tablet Take 500 mg by mouth daily. (Patient not taking: Reported on 02/11/2022)     No current facility-administered medications for this visit.    Known medication allergies: No Known Allergies   Physical examination: Blood pressure 120/60, pulse 87, temperature 98.4 F (36.9 C), temperature source Temporal, resp. rate 16, height 5' 5.5" (1.664 m), weight 169 lb 9.6 oz (76.9 kg), SpO2 96 %.  General: Alert, interactive, in no acute distress. HEENT: PERRLA, TMs pearly gray, turbinates non-edematous with clear discharge, post-pharynx non  erythematous. Neck: Supple without lymphadenopathy. Lungs: Clear to auscultation without wheezing, rhonchi or rales. {no increased work of breathing. CV: Normal S1, S2 without murmurs. Abdomen: Nondistended, nontender. Skin: Warm and dry, without lesions or rashes. Extremities:  No clubbing, cyanosis or edema. Neuro:   Grossly intact.  Diagnositics/Labs: Decision made today to defer allergy testing to if symptoms worsen, medications become ineffective or wants to proceed to allergen immunotherapy  Assessment and plan: Allergic rhinitis - Continue avoidance measures for pollens and feathers - Continue with: Xyzal (levocetirizine) '5mg'$  tablet once daily. Azelastine (also called Astepro over-the-counter) 2 sprays per nostril 1-2 times daily as needed for post nasal drip. Can use Flonase if you have nasal congestion and can use 2 sprays each nostril daily for 1-2 weeks at a time before stopping once nasal congestion improves for maximum benefit.  - If allergy symptoms change or medications become less effective then consider updating your allergy testing  Requesting records from [facility] in 1 year or sooner if needed  I appreciate the opportunity to take part in Kirsten Jones's care. Please do not hesitate to contact me with questions.  Sincerely,   Prudy Feeler, MD Allergy/Immunology [facility] [place]

## 2022-03-16 DIAGNOSIS — D0439 Carcinoma in situ of skin of other parts of face: Secondary | ICD-10-CM | POA: Diagnosis not present

## 2022-05-27 DIAGNOSIS — H2513 Age-related nuclear cataract, bilateral: Secondary | ICD-10-CM | POA: Diagnosis not present

## 2022-06-24 DIAGNOSIS — E8881 Metabolic syndrome: Secondary | ICD-10-CM | POA: Diagnosis not present

## 2022-06-24 DIAGNOSIS — N951 Menopausal and female climacteric states: Secondary | ICD-10-CM | POA: Diagnosis not present

## 2022-06-24 DIAGNOSIS — R5382 Chronic fatigue, unspecified: Secondary | ICD-10-CM | POA: Diagnosis not present

## 2022-06-24 DIAGNOSIS — E785 Hyperlipidemia, unspecified: Secondary | ICD-10-CM | POA: Diagnosis not present

## 2022-06-24 DIAGNOSIS — E039 Hypothyroidism, unspecified: Secondary | ICD-10-CM | POA: Diagnosis not present

## 2022-06-24 DIAGNOSIS — E559 Vitamin D deficiency, unspecified: Secondary | ICD-10-CM | POA: Diagnosis not present

## 2022-06-24 DIAGNOSIS — R799 Abnormal finding of blood chemistry, unspecified: Secondary | ICD-10-CM | POA: Diagnosis not present

## 2022-06-24 DIAGNOSIS — Z780 Asymptomatic menopausal state: Secondary | ICD-10-CM | POA: Diagnosis not present

## 2022-08-18 DIAGNOSIS — E039 Hypothyroidism, unspecified: Secondary | ICD-10-CM | POA: Diagnosis not present

## 2022-11-19 DIAGNOSIS — Z9109 Other allergy status, other than to drugs and biological substances: Secondary | ICD-10-CM | POA: Diagnosis not present

## 2022-11-24 DIAGNOSIS — Z1389 Encounter for screening for other disorder: Secondary | ICD-10-CM | POA: Diagnosis not present

## 2022-11-24 DIAGNOSIS — Z0000 Encounter for general adult medical examination without abnormal findings: Secondary | ICD-10-CM | POA: Diagnosis not present

## 2022-11-24 DIAGNOSIS — E663 Overweight: Secondary | ICD-10-CM | POA: Diagnosis not present

## 2022-11-25 ENCOUNTER — Other Ambulatory Visit: Payer: Self-pay | Admitting: Family Medicine

## 2022-11-25 DIAGNOSIS — Z1231 Encounter for screening mammogram for malignant neoplasm of breast: Secondary | ICD-10-CM

## 2023-01-07 DIAGNOSIS — R5382 Chronic fatigue, unspecified: Secondary | ICD-10-CM | POA: Diagnosis not present

## 2023-01-07 DIAGNOSIS — E785 Hyperlipidemia, unspecified: Secondary | ICD-10-CM | POA: Diagnosis not present

## 2023-01-07 DIAGNOSIS — E039 Hypothyroidism, unspecified: Secondary | ICD-10-CM | POA: Diagnosis not present

## 2023-01-07 DIAGNOSIS — E559 Vitamin D deficiency, unspecified: Secondary | ICD-10-CM | POA: Diagnosis not present

## 2023-01-07 DIAGNOSIS — E8881 Metabolic syndrome: Secondary | ICD-10-CM | POA: Diagnosis not present

## 2023-01-07 DIAGNOSIS — N951 Menopausal and female climacteric states: Secondary | ICD-10-CM | POA: Diagnosis not present

## 2023-01-07 DIAGNOSIS — R799 Abnormal finding of blood chemistry, unspecified: Secondary | ICD-10-CM | POA: Diagnosis not present

## 2023-01-07 DIAGNOSIS — Z780 Asymptomatic menopausal state: Secondary | ICD-10-CM | POA: Diagnosis not present

## 2023-05-19 DIAGNOSIS — R799 Abnormal finding of blood chemistry, unspecified: Secondary | ICD-10-CM | POA: Diagnosis not present

## 2023-05-19 DIAGNOSIS — Z780 Asymptomatic menopausal state: Secondary | ICD-10-CM | POA: Diagnosis not present

## 2023-05-19 DIAGNOSIS — N951 Menopausal and female climacteric states: Secondary | ICD-10-CM | POA: Diagnosis not present

## 2023-05-19 DIAGNOSIS — E559 Vitamin D deficiency, unspecified: Secondary | ICD-10-CM | POA: Diagnosis not present

## 2023-05-19 DIAGNOSIS — E8881 Metabolic syndrome: Secondary | ICD-10-CM | POA: Diagnosis not present

## 2023-05-19 DIAGNOSIS — R5382 Chronic fatigue, unspecified: Secondary | ICD-10-CM | POA: Diagnosis not present

## 2023-05-19 DIAGNOSIS — E039 Hypothyroidism, unspecified: Secondary | ICD-10-CM | POA: Diagnosis not present

## 2023-05-19 DIAGNOSIS — E785 Hyperlipidemia, unspecified: Secondary | ICD-10-CM | POA: Diagnosis not present

## 2023-07-04 DIAGNOSIS — J019 Acute sinusitis, unspecified: Secondary | ICD-10-CM | POA: Diagnosis not present

## 2023-07-13 DIAGNOSIS — J019 Acute sinusitis, unspecified: Secondary | ICD-10-CM | POA: Diagnosis not present

## 2023-07-13 DIAGNOSIS — Z6829 Body mass index (BMI) 29.0-29.9, adult: Secondary | ICD-10-CM | POA: Diagnosis not present

## 2023-07-15 DIAGNOSIS — D485 Neoplasm of uncertain behavior of skin: Secondary | ICD-10-CM | POA: Diagnosis not present

## 2023-07-15 DIAGNOSIS — L728 Other follicular cysts of the skin and subcutaneous tissue: Secondary | ICD-10-CM | POA: Diagnosis not present

## 2023-07-15 DIAGNOSIS — L720 Epidermal cyst: Secondary | ICD-10-CM | POA: Diagnosis not present

## 2023-07-15 DIAGNOSIS — L821 Other seborrheic keratosis: Secondary | ICD-10-CM | POA: Diagnosis not present

## 2023-07-15 DIAGNOSIS — L905 Scar conditions and fibrosis of skin: Secondary | ICD-10-CM | POA: Diagnosis not present

## 2023-07-15 DIAGNOSIS — L648 Other androgenic alopecia: Secondary | ICD-10-CM | POA: Diagnosis not present

## 2023-07-19 DIAGNOSIS — H43813 Vitreous degeneration, bilateral: Secondary | ICD-10-CM | POA: Diagnosis not present

## 2023-07-27 ENCOUNTER — Ambulatory Visit

## 2023-08-17 ENCOUNTER — Ambulatory Visit
Admission: RE | Admit: 2023-08-17 | Discharge: 2023-08-17 | Disposition: A | Discharge status: Home / Self Care | Source: Ambulatory Visit | Attending: Family Medicine | Admitting: Family Medicine

## 2023-08-17 DIAGNOSIS — Z1231 Encounter for screening mammogram for malignant neoplasm of breast: Secondary | ICD-10-CM | POA: Diagnosis not present

## 2023-09-06 DIAGNOSIS — R32 Unspecified urinary incontinence: Secondary | ICD-10-CM | POA: Diagnosis not present

## 2023-09-06 DIAGNOSIS — N812 Incomplete uterovaginal prolapse: Secondary | ICD-10-CM | POA: Diagnosis not present

## 2023-09-22 DIAGNOSIS — N8110 Cystocele, unspecified: Secondary | ICD-10-CM | POA: Diagnosis not present

## 2023-09-22 DIAGNOSIS — R351 Nocturia: Secondary | ICD-10-CM | POA: Diagnosis not present

## 2023-09-22 DIAGNOSIS — R3915 Urgency of urination: Secondary | ICD-10-CM | POA: Diagnosis not present

## 2023-09-22 DIAGNOSIS — N3942 Incontinence without sensory awareness: Secondary | ICD-10-CM | POA: Diagnosis not present

## 2023-09-22 DIAGNOSIS — N819 Female genital prolapse, unspecified: Secondary | ICD-10-CM | POA: Diagnosis not present

## 2023-09-22 DIAGNOSIS — M62838 Other muscle spasm: Secondary | ICD-10-CM | POA: Diagnosis not present

## 2023-09-22 DIAGNOSIS — M6281 Muscle weakness (generalized): Secondary | ICD-10-CM | POA: Diagnosis not present

## 2023-09-24 DIAGNOSIS — L728 Other follicular cysts of the skin and subcutaneous tissue: Secondary | ICD-10-CM | POA: Diagnosis not present

## 2023-09-24 DIAGNOSIS — L905 Scar conditions and fibrosis of skin: Secondary | ICD-10-CM | POA: Diagnosis not present

## 2023-09-24 DIAGNOSIS — L648 Other androgenic alopecia: Secondary | ICD-10-CM | POA: Diagnosis not present

## 2023-09-30 DIAGNOSIS — N3942 Incontinence without sensory awareness: Secondary | ICD-10-CM | POA: Diagnosis not present

## 2023-09-30 DIAGNOSIS — N819 Female genital prolapse, unspecified: Secondary | ICD-10-CM | POA: Diagnosis not present

## 2023-09-30 DIAGNOSIS — N8110 Cystocele, unspecified: Secondary | ICD-10-CM | POA: Diagnosis not present

## 2023-09-30 DIAGNOSIS — R3915 Urgency of urination: Secondary | ICD-10-CM | POA: Diagnosis not present

## 2023-09-30 DIAGNOSIS — M62838 Other muscle spasm: Secondary | ICD-10-CM | POA: Diagnosis not present

## 2023-09-30 DIAGNOSIS — R351 Nocturia: Secondary | ICD-10-CM | POA: Diagnosis not present

## 2023-09-30 DIAGNOSIS — M6281 Muscle weakness (generalized): Secondary | ICD-10-CM | POA: Diagnosis not present

## 2023-10-07 DIAGNOSIS — R3915 Urgency of urination: Secondary | ICD-10-CM | POA: Diagnosis not present

## 2023-10-07 DIAGNOSIS — R351 Nocturia: Secondary | ICD-10-CM | POA: Diagnosis not present

## 2023-10-07 DIAGNOSIS — N3942 Incontinence without sensory awareness: Secondary | ICD-10-CM | POA: Diagnosis not present

## 2023-10-07 DIAGNOSIS — N819 Female genital prolapse, unspecified: Secondary | ICD-10-CM | POA: Diagnosis not present

## 2023-10-07 DIAGNOSIS — M62838 Other muscle spasm: Secondary | ICD-10-CM | POA: Diagnosis not present

## 2023-10-07 DIAGNOSIS — N8110 Cystocele, unspecified: Secondary | ICD-10-CM | POA: Diagnosis not present

## 2023-10-07 DIAGNOSIS — M6281 Muscle weakness (generalized): Secondary | ICD-10-CM | POA: Diagnosis not present

## 2023-10-14 DIAGNOSIS — R351 Nocturia: Secondary | ICD-10-CM | POA: Diagnosis not present

## 2023-10-14 DIAGNOSIS — M6281 Muscle weakness (generalized): Secondary | ICD-10-CM | POA: Diagnosis not present

## 2023-10-14 DIAGNOSIS — R3915 Urgency of urination: Secondary | ICD-10-CM | POA: Diagnosis not present

## 2023-10-14 DIAGNOSIS — M62838 Other muscle spasm: Secondary | ICD-10-CM | POA: Diagnosis not present

## 2023-10-14 DIAGNOSIS — N819 Female genital prolapse, unspecified: Secondary | ICD-10-CM | POA: Diagnosis not present

## 2023-10-14 DIAGNOSIS — N3942 Incontinence without sensory awareness: Secondary | ICD-10-CM | POA: Diagnosis not present

## 2023-10-14 DIAGNOSIS — N8110 Cystocele, unspecified: Secondary | ICD-10-CM | POA: Diagnosis not present

## 2023-10-21 DIAGNOSIS — N819 Female genital prolapse, unspecified: Secondary | ICD-10-CM | POA: Diagnosis not present

## 2023-10-21 DIAGNOSIS — M6281 Muscle weakness (generalized): Secondary | ICD-10-CM | POA: Diagnosis not present

## 2023-10-21 DIAGNOSIS — R351 Nocturia: Secondary | ICD-10-CM | POA: Diagnosis not present

## 2023-10-21 DIAGNOSIS — N8110 Cystocele, unspecified: Secondary | ICD-10-CM | POA: Diagnosis not present

## 2023-10-21 DIAGNOSIS — M62838 Other muscle spasm: Secondary | ICD-10-CM | POA: Diagnosis not present

## 2023-10-21 DIAGNOSIS — R3915 Urgency of urination: Secondary | ICD-10-CM | POA: Diagnosis not present

## 2023-10-21 DIAGNOSIS — N3942 Incontinence without sensory awareness: Secondary | ICD-10-CM | POA: Diagnosis not present

## 2023-10-27 DIAGNOSIS — E8881 Metabolic syndrome: Secondary | ICD-10-CM | POA: Diagnosis not present

## 2023-10-27 DIAGNOSIS — E039 Hypothyroidism, unspecified: Secondary | ICD-10-CM | POA: Diagnosis not present

## 2023-10-27 DIAGNOSIS — N951 Menopausal and female climacteric states: Secondary | ICD-10-CM | POA: Diagnosis not present

## 2023-10-27 DIAGNOSIS — E559 Vitamin D deficiency, unspecified: Secondary | ICD-10-CM | POA: Diagnosis not present

## 2023-10-27 DIAGNOSIS — E785 Hyperlipidemia, unspecified: Secondary | ICD-10-CM | POA: Diagnosis not present

## 2023-10-27 DIAGNOSIS — R799 Abnormal finding of blood chemistry, unspecified: Secondary | ICD-10-CM | POA: Diagnosis not present

## 2023-10-27 DIAGNOSIS — Z780 Asymptomatic menopausal state: Secondary | ICD-10-CM | POA: Diagnosis not present

## 2023-10-27 DIAGNOSIS — R5382 Chronic fatigue, unspecified: Secondary | ICD-10-CM | POA: Diagnosis not present

## 2023-11-18 DIAGNOSIS — E663 Overweight: Secondary | ICD-10-CM | POA: Diagnosis not present

## 2023-12-02 DIAGNOSIS — R3915 Urgency of urination: Secondary | ICD-10-CM | POA: Diagnosis not present

## 2023-12-02 DIAGNOSIS — N8110 Cystocele, unspecified: Secondary | ICD-10-CM | POA: Diagnosis not present

## 2023-12-02 DIAGNOSIS — M6281 Muscle weakness (generalized): Secondary | ICD-10-CM | POA: Diagnosis not present

## 2023-12-02 DIAGNOSIS — R351 Nocturia: Secondary | ICD-10-CM | POA: Diagnosis not present

## 2023-12-02 DIAGNOSIS — M62838 Other muscle spasm: Secondary | ICD-10-CM | POA: Diagnosis not present

## 2023-12-02 DIAGNOSIS — N819 Female genital prolapse, unspecified: Secondary | ICD-10-CM | POA: Diagnosis not present

## 2023-12-02 DIAGNOSIS — N3942 Incontinence without sensory awareness: Secondary | ICD-10-CM | POA: Diagnosis not present

## 2023-12-09 DIAGNOSIS — R350 Frequency of micturition: Secondary | ICD-10-CM | POA: Diagnosis not present

## 2023-12-09 DIAGNOSIS — Z6827 Body mass index (BMI) 27.0-27.9, adult: Secondary | ICD-10-CM | POA: Diagnosis not present

## 2023-12-19 DIAGNOSIS — E663 Overweight: Secondary | ICD-10-CM | POA: Diagnosis not present

## 2024-02-21 DIAGNOSIS — R5382 Chronic fatigue, unspecified: Secondary | ICD-10-CM | POA: Diagnosis not present

## 2024-02-21 DIAGNOSIS — N951 Menopausal and female climacteric states: Secondary | ICD-10-CM | POA: Diagnosis not present

## 2024-02-21 DIAGNOSIS — E559 Vitamin D deficiency, unspecified: Secondary | ICD-10-CM | POA: Diagnosis not present

## 2024-02-21 DIAGNOSIS — E039 Hypothyroidism, unspecified: Secondary | ICD-10-CM | POA: Diagnosis not present

## 2024-02-21 DIAGNOSIS — R799 Abnormal finding of blood chemistry, unspecified: Secondary | ICD-10-CM | POA: Diagnosis not present

## 2024-02-21 DIAGNOSIS — Z780 Asymptomatic menopausal state: Secondary | ICD-10-CM | POA: Diagnosis not present

## 2024-02-21 DIAGNOSIS — E785 Hyperlipidemia, unspecified: Secondary | ICD-10-CM | POA: Diagnosis not present

## 2024-02-21 DIAGNOSIS — E8881 Metabolic syndrome: Secondary | ICD-10-CM | POA: Diagnosis not present

## 2024-03-29 DIAGNOSIS — L648 Other androgenic alopecia: Secondary | ICD-10-CM | POA: Diagnosis not present
